# Patient Record
Sex: Male | Born: 1955 | Race: White | Hispanic: No | Marital: Married | Smoking: Current every day smoker
Health system: Southern US, Community
[De-identification: ages and names within clinical notes are randomized; demographics above are authoritative.]

## PROBLEM LIST (undated history)

## (undated) VITALS — BP 134/78 | HR 89 | Temp 97.3°F | Resp 12

## (undated) DIAGNOSIS — I10 Essential (primary) hypertension: Secondary | ICD-10-CM

## (undated) DIAGNOSIS — I2699 Other pulmonary embolism without acute cor pulmonale: Secondary | ICD-10-CM

## (undated) DIAGNOSIS — E119 Type 2 diabetes mellitus without complications: Secondary | ICD-10-CM

## (undated) DIAGNOSIS — M792 Neuralgia and neuritis, unspecified: Secondary | ICD-10-CM

## (undated) DIAGNOSIS — N1330 Unspecified hydronephrosis: Secondary | ICD-10-CM

## (undated) DIAGNOSIS — M5430 Sciatica, unspecified side: Secondary | ICD-10-CM

## (undated) DIAGNOSIS — G2581 Restless legs syndrome: Secondary | ICD-10-CM

## (undated) DIAGNOSIS — M1990 Unspecified osteoarthritis, unspecified site: Secondary | ICD-10-CM

## (undated) DIAGNOSIS — N35919 Unspecified urethral stricture, male, unspecified site: Secondary | ICD-10-CM

## (undated) DIAGNOSIS — E7800 Pure hypercholesterolemia, unspecified: Secondary | ICD-10-CM

## (undated) DIAGNOSIS — F419 Anxiety disorder, unspecified: Secondary | ICD-10-CM

## (undated) DIAGNOSIS — R339 Retention of urine, unspecified: Secondary | ICD-10-CM

## (undated) HISTORY — PX: TONSILLECTOMY: SUR1361

## (undated) HISTORY — DX: Restless legs syndrome: G25.81

## (undated) HISTORY — DX: Type 2 diabetes mellitus without complications: E11.9

## (undated) HISTORY — DX: Essential (primary) hypertension: I10

---
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
## 2016-07-02 ENCOUNTER — Other Ambulatory Visit
Admission: RE | Admit: 2016-07-02 | Discharge: 2016-07-02 | Disposition: A | Discharge status: Home / Self Care | Payer: Self-pay | Source: Ambulatory Visit | Admitting: Urology

## 2016-07-07 LAB — MEDICAL CYTOLOGY

## 2017-01-08 ENCOUNTER — Other Ambulatory Visit
Admission: RE | Admit: 2017-01-08 | Discharge: 2017-01-08 | Disposition: A | Discharge status: Home / Self Care | Payer: Self-pay | Source: Ambulatory Visit | Attending: Family | Admitting: Family

## 2017-01-09 LAB — AEROBIC CULTURE: Aerobic Culture: 0

## 2019-06-21 ENCOUNTER — Emergency Department: Payer: BC Managed Care – PPO

## 2019-06-21 ENCOUNTER — Encounter: Payer: Self-pay | Admitting: Emergency Medicine

## 2019-06-21 ENCOUNTER — Other Ambulatory Visit: Payer: Self-pay

## 2019-06-21 ENCOUNTER — Emergency Department
Admission: EM | Admit: 2019-06-21 | Discharge: 2019-06-21 | Disposition: A | Discharge status: Home / Self Care | Payer: BC Managed Care – PPO | Attending: Emergency Medicine | Admitting: Emergency Medicine

## 2019-06-21 DIAGNOSIS — I10 Essential (primary) hypertension: Secondary | ICD-10-CM | POA: Diagnosis not present

## 2019-06-21 DIAGNOSIS — E119 Type 2 diabetes mellitus without complications: Secondary | ICD-10-CM | POA: Insufficient documentation

## 2019-06-21 DIAGNOSIS — F17210 Nicotine dependence, cigarettes, uncomplicated: Secondary | ICD-10-CM | POA: Diagnosis not present

## 2019-06-21 DIAGNOSIS — M4726 Other spondylosis with radiculopathy, lumbar region: Secondary | ICD-10-CM

## 2019-06-21 DIAGNOSIS — M4727 Other spondylosis with radiculopathy, lumbosacral region: Secondary | ICD-10-CM | POA: Diagnosis not present

## 2019-06-21 DIAGNOSIS — M5136 Other intervertebral disc degeneration, lumbar region: Secondary | ICD-10-CM | POA: Insufficient documentation

## 2019-06-21 DIAGNOSIS — M545 Low back pain: Secondary | ICD-10-CM | POA: Diagnosis present

## 2019-06-21 LAB — URINALYSIS, COMPLETE (UACMP) WITH MICROSCOPIC
Bacteria, UA: NONE SEEN
Bilirubin Urine: NEGATIVE
Glucose, UA: >=500 mg/dL — AB
Hgb urine dipstick: NEGATIVE
Ketones, ur: NEGATIVE mg/dL
Leukocytes,Ua: NEGATIVE
Nitrite: NEGATIVE
Protein, ur: NEGATIVE mg/dL
Specific Gravity, Urine: 1.031 — ABNORMAL HIGH (ref 1.005–1.030)
pH: 5 (ref 5.0–8.0)

## 2019-06-21 LAB — GLUCOSE, CAPILLARY: Glucose-Capillary: 278 mg/dL — ABNORMAL HIGH (ref 70–99)

## 2019-06-21 MED ORDER — OXYCODONE HCL 5 MG PO TABS
5.0000 mg | ORAL_TABLET | Freq: Once | ORAL | Status: AC
  Administered 2019-06-21: 5 mg via ORAL
  Filled 2019-06-21: qty 1

## 2019-06-21 MED ORDER — OXYCODONE HCL 5 MG PO TABS: 5.0000 mg | ORAL_TABLET | Freq: Two times a day (BID) | ORAL | 0 refills | Status: AC | PRN

## 2019-06-21 MED ORDER — ACETAMINOPHEN 500 MG PO TABS
1000.0000 mg | ORAL_TABLET | Freq: Once | ORAL | Status: AC
  Administered 2019-06-21: 05:00:00 1000 mg via ORAL
  Filled 2019-06-21: qty 2

## 2019-06-21 NOTE — Discharge Instructions (Addendum)
You have been seen in the Emergency Department (ED)  today for back pain.  Back pain has many possible causes some are related to muscles while others have more serious causes. Even though you were checked carefully today and your exam and evaluation were reassuring, problems may develop later or continue to unfold. Therefore it is imperative that you follow up with doctor closely for further evaluation.  Follow-up with your doctor in 1 day for further evaluation.  For pain control take: tylenol 1000mg  3 times a day and 5mg  of oxycodone as needed every 6 hours  When should you call for help?  Call your doctor now or seek immediate medical care if:  You have new or worsening numbness in your legs.  You have new or worsening weakness in your legs. (This could make it hard to stand up.)  You lose control of your bladder or bowels or if you are unable to urinate. You have numbness of your groin or buttock region If you develop a fever  Watch closely for changes in your health, and be sure to contact your doctor if:  Your pain gets worse.  You are not getting better after 2 weeks.  How can you care for yourself at home?  Take pain medicines exactly as directed.  If the doctor gave you a prescription medicine for pain, take it as prescribed.  If you are not taking a prescription pain medicine, ask your doctor if you can take an over-the-counter medicine like tylenol or ibuprofen. Sit or lie in positions that are most comfortable and reduce your pain. Try one of these positions when you lie down:  Lie on your back with your knees bent and supported by large pillows.  Lie on the floor with your legs on the seat of a sofa or chair.  Lie on your side with your knees and hips bent and a pillow between your legs.  Lie on your stomach if it does not make pain worse. Do not sit up in bed, and avoid soft couches and twisted positions. Bed rest can help relieve pain at first, but it delays healing. Avoid  bed rest after the first day of back pain.  Change positions every 30 minutes. If you must sit for long periods of time, take breaks from sitting. Get up and walk around, or lie in a comfortable position.  Try using a heating pad on a low or medium setting for 15 to 20 minutes every 2 or 3 hours. Try a warm shower in place of one session with the heating pad.  You can also try an ice pack for 10 to 15 minutes every 2 to 3 hours. Put a thin cloth between the ice pack and your skin.  Take short walks several times a day. You can start with 5 to 10 minutes, 3 or 4 times a day, and work up to longer walks. Walk on level surfaces and avoid hills and stairs until your back is better.  Return to work and other activities as soon as you can. Continued rest without activity is usually not good for your back.  To prevent future back pain, do exercises to stretch and strengthen your back and stomach. Learn how to use good posture, safe lifting techniques, and proper body mechanics.

## 2019-06-21 NOTE — ED Notes (Signed)
Patient transported to CT 

## 2019-06-21 NOTE — ED Notes (Signed)
Patient states having back pain from L3-L4 in that area. Per pt has had hx of sciatica.

## 2019-06-21 NOTE — ED Triage Notes (Signed)
Pt to triage via w/c, mask in place with no distress noted; pt c/o lower back pain; seen 2wks ago by chiropractor but pain has persisted

## 2019-06-21 NOTE — ED Provider Notes (Signed)
[facility] Emergency Department Provider Note  ____________________________________________  Time seen: Approximately 6:30 AM  I have reviewed the triage vital signs and the nursing notes.   HISTORY  Chief Complaint No chief complaint on file.   HPI Ian Hunter is a 63 y.o. male history of diabetes who presents for evaluation of back pain.  Pain started 2 weeks ago after patient went to the chiropractor.  Patient reports that he usually goes to the chiropractor to get "his hips back in place".  He left the chiropractor and had severe pain for 2 days.  The pain improved but was persistent for 2 weeks.  Over the last 24 hours the pain has become severe again.  The pain is located on the left paraspinal region, sharp and stabbing, constant and nonradiating.  He reports burning of the sole of the left foot which has also been there for 2 weeks.  He denies saddle anesthesia, urinary or bowel incontinence or retention, lower extremity weakness or numbness.  No history of IV drug use, no unintentional weight loss.   PMH HTN DM  Allergies Patient has no known allergies.  No family history on file.  Social History Social History   Tobacco Use   Smoking status: Current Every Day Smoker   Smokeless tobacco: Never Used  Substance Use Topics   Alcohol use: Not on file   Drug use: Not on file    Review of Systems  Constitutional: Negative for fever. Eyes: Negative for visual changes. ENT: Negative for sore throat. Neck: No neck pain  Cardiovascular: Negative for chest pain. Respiratory: Negative for shortness of breath. Gastrointestinal: Negative for abdominal pain, vomiting or diarrhea. Genitourinary: Negative for dysuria. Musculoskeletal: + back pain. Skin: Negative for rash. Neurological: Negative for headaches, weakness or numbness. Psych: No SI or HI  ____________________________________________   PHYSICAL EXAM:  VITAL SIGNS: ED Triage  Vitals  Enc Vitals Group     BP 06/21/19 0137 (!) 137/94     Pulse Rate 06/21/19 0137 99     Resp 06/21/19 0137 (!) 22     Temp 06/21/19 0137 98 F (36.7 C)     Temp Source 06/21/19 0137 Oral     SpO2 06/21/19 0137 97 %     Weight 06/21/19 0136 294 lb (133.4 kg)     Height 06/21/19 0136 6\' 2"  (1.88 m)     Head Circumference --      Peak Flow --      Pain Score 06/21/19 0136 8     Pain Loc --      Pain Edu? --      Excl. in [place]? --     Constitutional: Alert and oriented. Well appearing and in no apparent distress. HEENT:      Head: Normocephalic and atraumatic.         Eyes: Conjunctivae are normal. Sclera is non-icteric.       Mouth/Throat: Mucous membranes are moist.       Neck: Supple with no signs of meningismus. Cardiovascular: Regular rate and rhythm. Respiratory: Normal respiratory effort.  Gastrointestinal: Soft, non tender, and non distended with positive bowel sounds. No rebound or guarding. Musculoskeletal: No midline CT and L-spine tenderness.  Patient has tenderness to palpation on the left paraspinal lumbar region.  No rash. Neurologic: Normal speech and language. Face is symmetric. Moving all extremities.  Normal gait, 2+ patella DTR bilaterally, intact strength and sensation of bilateral lower extremities Skin: Skin is warm, dry and  intact. No rash noted. Psychiatric: Mood and affect are normal. Speech and behavior are normal.  ____________________________________________   LABS (all labs ordered are listed, but only abnormal results are displayed)  Labs Reviewed  URINALYSIS, COMPLETE (UACMP) WITH MICROSCOPIC - Abnormal; Notable for the following components:      Result Value   Color, Urine YELLOW (*)    APPearance CLEAR (*)    Specific Gravity, Urine 1.031 (*)    Glucose, UA >=500 (*)    All other components within normal limits  GLUCOSE, CAPILLARY - Abnormal; Notable for the following components:   Glucose-Capillary 278 (*)    All other components  within normal limits  CBG MONITORING, ED   ____________________________________________  EKG  none  ____________________________________________  RADIOLOGY  I have personally reviewed the images performed during this visit and I agree with the Radiologist's read.   Interpretation by Radiologist:  Ct Lumbar Spine Wo Contrast  Result Date: 06/21/2019 CLINICAL DATA:  Back pain EXAM: CT LUMBAR SPINE WITHOUT CONTRAST TECHNIQUE: Multidetector CT imaging of the lumbar spine was performed without intravenous contrast administration. Multiplanar CT image reconstructions were also generated. COMPARISON:  None. FINDINGS: Segmentation: 5 lumbar type vertebrae Alignment: Normal Vertebrae: No acute fracture or focal pathologic process. Paraspinal and other soft tissues: Partial coverage of full urinary bladder, nonspecific. Atherosclerotic calcification. Disc levels: Mild multilevel endplate spurring with mild L3-4 disc narrowing. Facet degeneration at L4-5 and L5-S1 prominent spurring on the right at L5-S1. No visible impingement. IMPRESSION: 1. No acute finding. 2. Degenerative changes without visible impingement. Electronically Signed   By: Marnee Spring M.D.   On: 06/21/2019 06:22     ____________________________________________   PROCEDURES  Procedure(s) performed: None Procedures Critical Care performed:  None ____________________________________________   INITIAL IMPRESSION / ASSESSMENT AND PLAN / ED COURSE   63 y.o. male history of diabetes who presents for evaluation of back pain x 2 weeks since going to the chiropractor. Patient is neurologically intact with no signs of cauda equina, normal gait, strength, sensation, and intact DTRs. Abdomen is soft and non tender with no pulsatile mass. CT showing DDD and arthritis of the spine with no fracture or visible impingement. Patient referred to orthopedics. Given short course of oxycodone for pain. Discussed signs and symptoms of cauda  equina and recommended return to the emergency room if these develop.  Otherwise patient will follow-up with orthopedics.       As part of my medical decision making, I reviewed the following data within the electronic MEDICAL RECORD NUMBER Nursing notes reviewed and incorporated, Labs reviewed , Old chart reviewed, Radiograph reviewed , Notes from prior ED visits and [place] Controlled Substance Database   Patient was evaluated in Emergency Department today for the symptoms described in the history of present illness. Patient was evaluated in the context of the global COVID-19 pandemic, which necessitated consideration that the patient might be at risk for infection with the SARS-CoV-2 virus that causes COVID-19. Institutional protocols and algorithms that pertain to the evaluation of patients at risk for COVID-19 are in a state of rapid change based on information released by regulatory bodies including the CDC and federal and state organizations. These policies and algorithms were followed during the patient's care in the ED.   ____________________________________________   FINAL CLINICAL IMPRESSION(S) / ED DIAGNOSES   Final diagnoses:  Osteoarthritis of spine with radiculopathy, lumbar region      NEW MEDICATIONS STARTED DURING THIS VISIT:  ED Discharge Orders    None  Note:  This document was prepared using Dragon voice recognition software and may include unintentional dictation errors.    Don PerkingVeronese, [place], MD 06/21/19 [phone]

## 2020-07-04 IMAGING — CT CT L SPINE W/O CM
3 series · 14 of 33 positions shown, 17 images · non-contrast
Comparison: None.

CLINICAL DATA: Back pain

EXAM:
CT LUMBAR SPINE WITHOUT CONTRAST
TECHNIQUE: Multidetector CT imaging of the lumbar spine was performed without
intravenous contrast administration. Multiplanar CT image
reconstructions were also generated.

[Series 4: l spine soft · axial · 0.38mm/px · z∈[-1058,-868]mm · 6 of 125 slices shown, 8 images]
[im 20/125  soft-tissue]
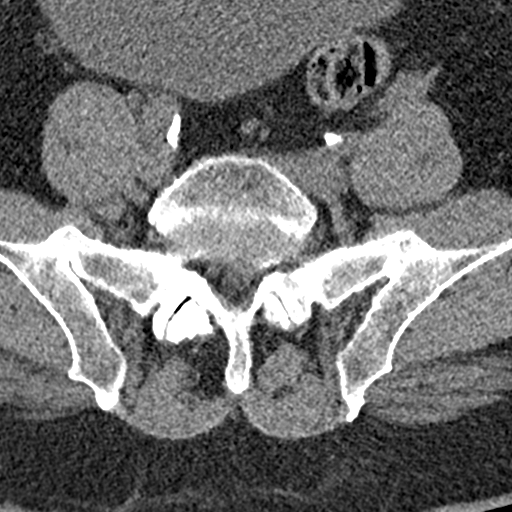
[im 20/125  bone]
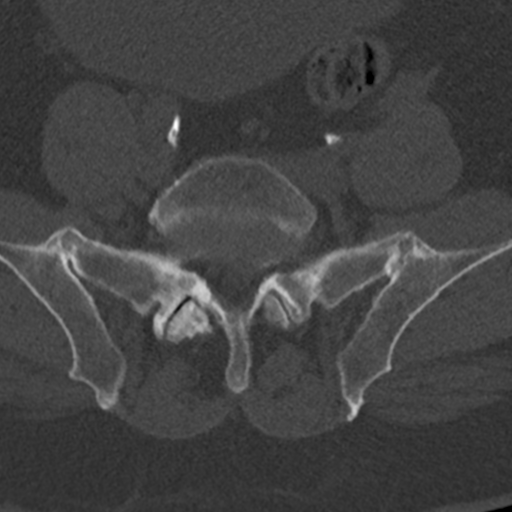
[im 39/125  bone]
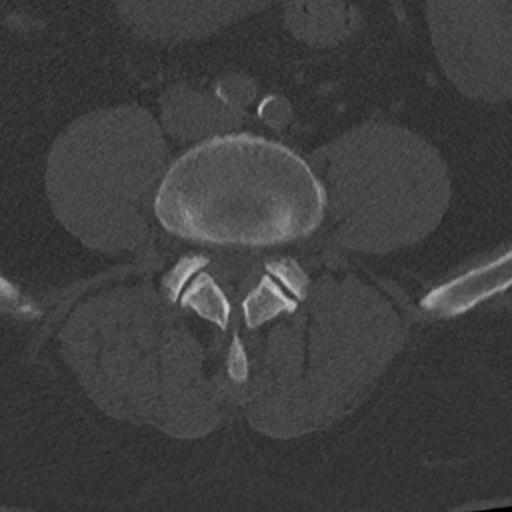
[im 58/125  bone]
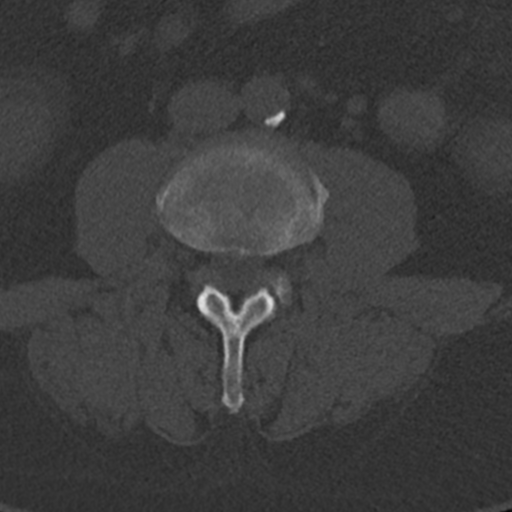
[im 77/125  bone]
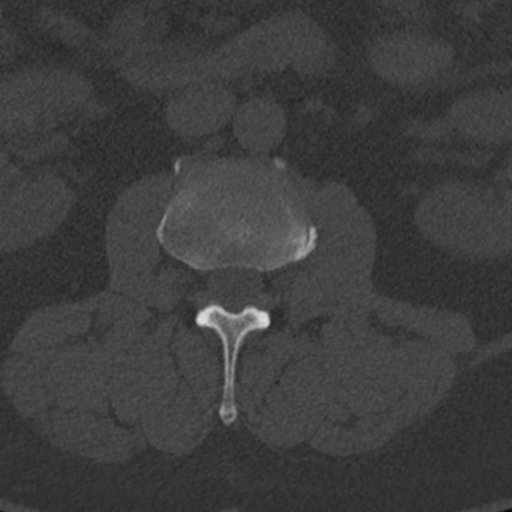
[im 96/125  soft-tissue]
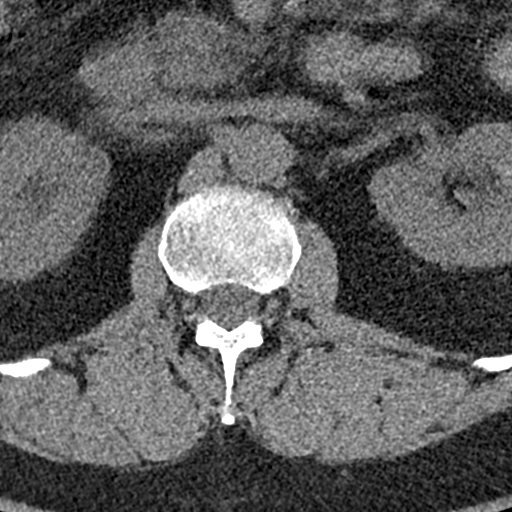
[im 96/125  bone]
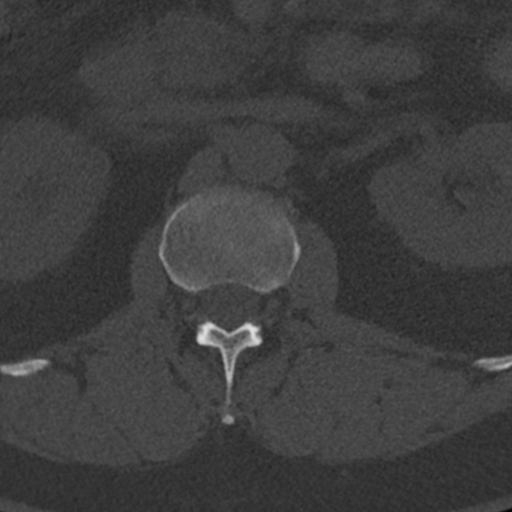
[im 115/125  bone]
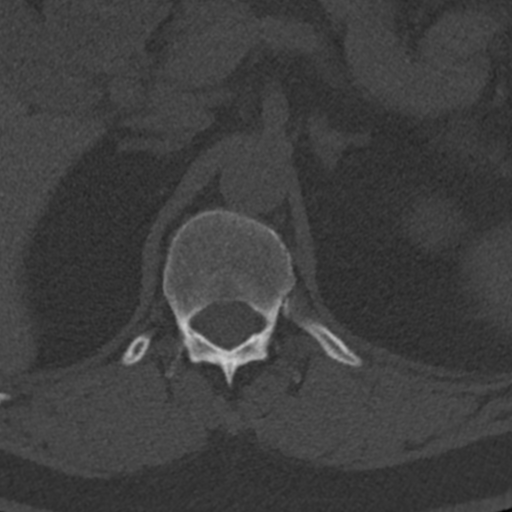

[Series 5: sagittal bone · sagittal · 0.36mm/px · 5 of 61 slices shown, 6 images]
[im 21/61  bone]
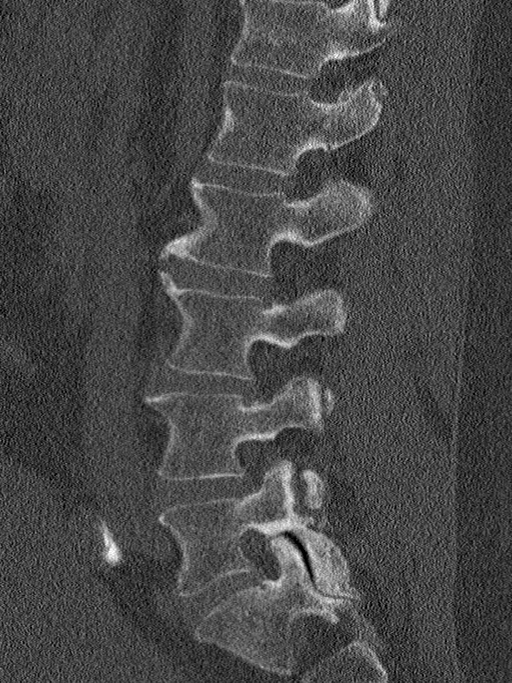
[im 26/61  bone]
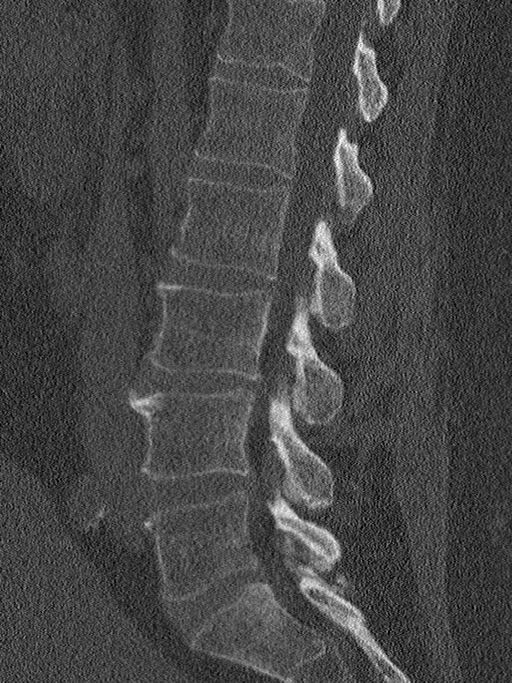
[im 31/61  soft-tissue]
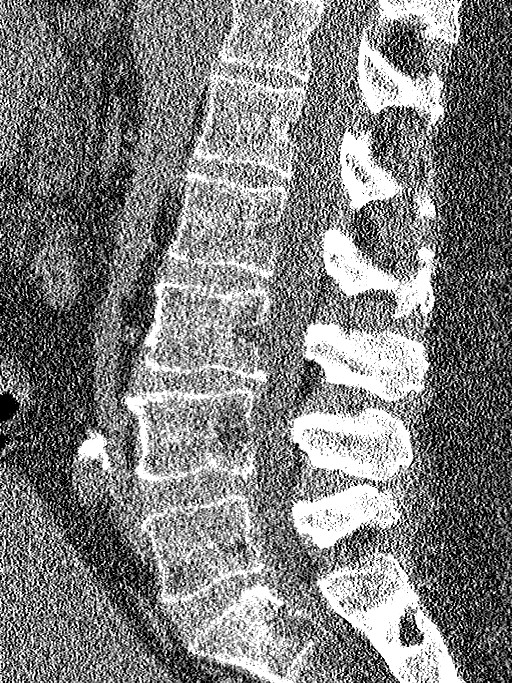
[im 31/61  bone]
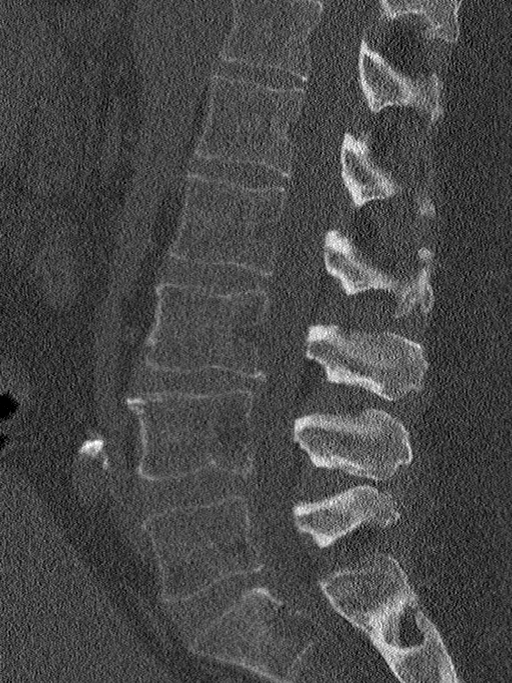
[im 36/61  bone]
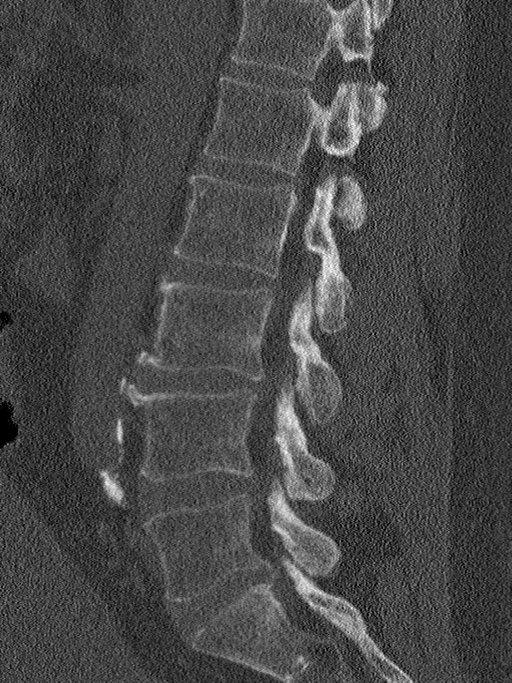
[im 41/61  bone]
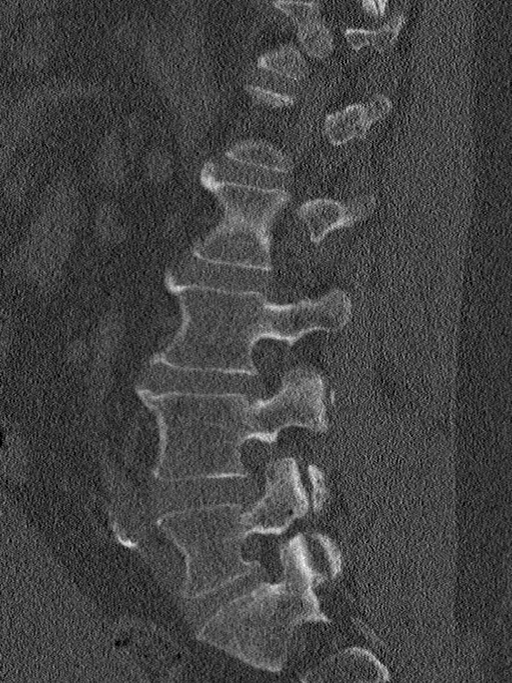

[Series 6: coronal bone · coronal · 0.36mm/px · 3 of 61 slices shown]
[im 13/61  bone]
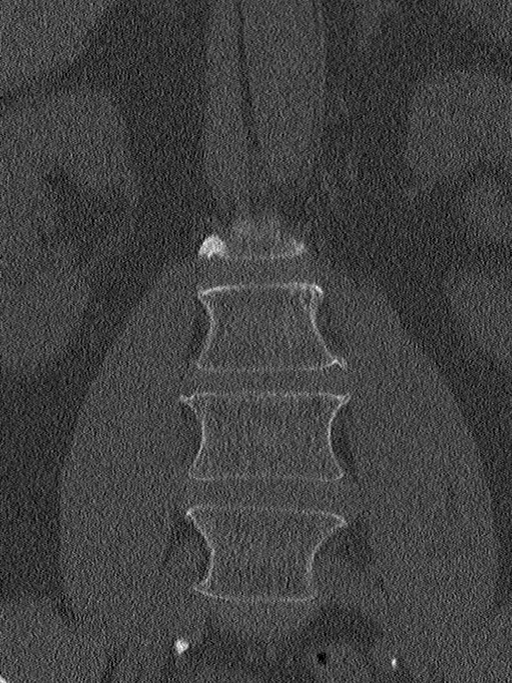
[im 25/61  bone]
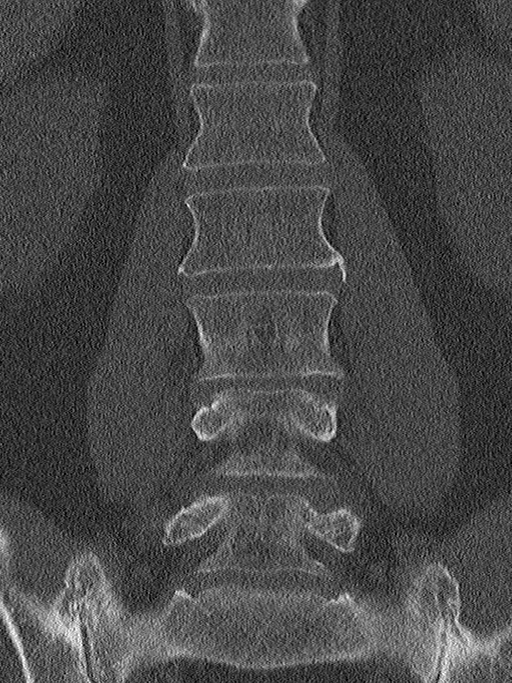
[im 37/61  bone]
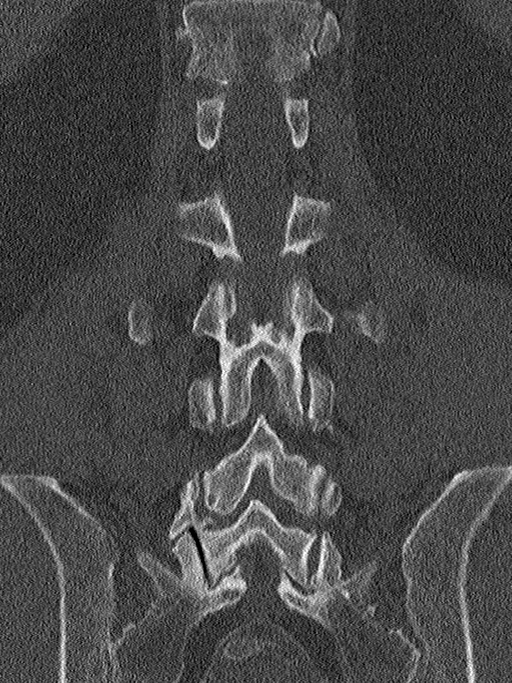

[14 of 33 positions shown; findings below may reference images not displayed]

FINDINGS: Segmentation: 5 lumbar type vertebrae

Alignment: Normal

Vertebrae: No acute fracture or focal pathologic process.

Paraspinal and other soft tissues: Partial coverage of full urinary
bladder, nonspecific. Atherosclerotic calcification.

Disc levels: Mild multilevel endplate spurring with mild L3-4 disc
narrowing. Facet degeneration at L4-5 and L5-S1 prominent spurring
on the right at L5-S1. No visible impingement.
IMPRESSION: 1. No acute finding.
2. Degenerative changes without visible impingement.

## 2020-08-13 ENCOUNTER — Encounter: Payer: Self-pay | Admitting: Primary Care

## 2020-08-13 NOTE — Progress Notes (Signed)
Patient Intake    Problem    Problems/Comments    Patient presents to the office today for a 3 month follow up. Patient states   he is starting to lose   the feeling in his feet and he knows it is related to his blood sugars.   Patient states he does not   monitor his sugars so "it is what it is". Patient would like a refill of   lisinopril 5mg  sent to   [facility]. Patient is due for CRCS.        Medication list    Medications    Last Reconciled on 08/13/20 13:37 by Karon Heckendorn C Willys Salvino PAC    Clonazepam (Clonazepam 1 mg) 1 Mg Tab    1 MG PO BID MDD TWO for 30 Days, #60 TAB 0 Refills       Prov: Brien Lowe,Nylan Nakatani C PA-C         07/03/20     Cyclobenzaprine Hcl (Flexeril 10mg ) 10 Mg Tab    1 TAB PO TID PRN for Muscle Spasms for 10 Days, #30 TAB 1 Refill       Prov: Nickolai Rinks,Jaidan Stachnik C PA-C         06/28/20     Fluoxetine HCl (Fluoxetine 10 mg) 10 Mg Tab    10 MG PO DAILY, #90 TAB 2 Refills       Prov: Pearson Picou,Tymara Saur C PA-C         06/28/20     Pramipexole Dihydrochloride (Pramipexole 1 MG) 1 Mg Tab    1 TAB PO QID, #120 TAB 2 Refills       Prov: Falana Clagg,Deyon Chizek C PA-C         06/24/20     Atorvastatin Calcium (Lipitor 10 Mg) 10 Mg Tab    10 MG PO DAILY, #30 TAB 5 Refills       Prov: Neyra Pettie,Bowden Boody C PA-C         05/02/20     Glimepiride (Amaryl 4mg ) 4 Mg Tab    4 MG PO DAILY, #90 TAB 2 Refills       Prov: GEMBUSIA,KEITH A DO         04/12/20     Lisinopril (Lisinopril 5 mg) 5 Mg Tab    5 MG PO DAILY, #90 TAB       Reported         02/15/20     Apixaban (Eliquis 2.5 MG) 2.5 Mg Tab    1 TAB PO BID, #180 TAB       Reported         02/15/20    PMP Checked:  Not Applicable    Nurse update/review meds w/Pt:  Yes    Date updated/reviewed:  Aug 13, 2020        Medication Management    Understands current meds:  Yes    Date reviewed w/pt, family:  Aug 13, 2020    Date barriers identified:  Aug 13, 2020        Allergies    Allergies:      Coded Allergies:           Monosodium Glutamate (Verified  Allergy, Unknown, ANXIETY/FEELING OF   FEAR, 02/25/15)         Vitals    Vitals:           Weight 286 lbs  / 539.767341 kg         Temperature 98.3 F / 36.83 C - Tympanic  Pulse 83         Respirations 16         Blood Pressure 128/82 Sitting, Left Arm         Pulse Oximetry 98%        Pain Scale    Is the patient having any pain:  No        Self Referral since last visit    Patient self referred?:  No        Educ: Discussed/Distributed    Education Discussed with Pt:  Yes    Educational Materials:  Medication, Follow up visit, Vital signs        PHQ-2 Depression Screening    Instructions    Over the last 2 weeks, how often have you been bothered by any of the   following problems?  (Please   circle your response)    Established Patient Assessment:  Routine Vitals, Check-In, Review of Past   Record    Patient monitoring:  Pulse Ox        Nurse Code Visit    [id number] BP,Wt Chk,Pill Ct, Etc:  No        Performing Nurse:    Performing Nurse:    08/13/20 13:24  [place]        Chief Complaint    Chief Complaint    routine follow up appointment        HPI    History of Present Illness    Details    Craig Gilbert presents to the office today for a routine follow up appointment. The   patient was last seen in  the office back in August 2021. The patient did not have his blood work   completed that was ordered   at his last appointment. 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 He does not check his blood sugars   at all. He admits that   he started to lose feeling in his feet and he suspects that this is related to   diabetic neuropathy.    Vital Signs Reviewed:  Vital signs obtained today reviewed        Past, Family,   Social History    Tobacco Use    Tobacco status:  Current every day smoker    Smoking packs/day:  1    Smoking history:  > 50 pack years    Quit status:  Not considering quitting    Counseling given:  Provider counseling        Alcohol Use    Alcohol intake:  None    Counseling  given:  Provider counseling        Substance Use    Substance use:  Marijuana    Counseling given:  Provider counseling        Past Medical History    Endocrine:  REPORTS HX OF: Diabetes mellitus (uncontrolled), Obesity    Respiratory:  REPORTS HX OF: Asthma, Pneumonia    Cardiovascular:  REPORTS HX OF: Deep venous thrombosis (12/2018 [place]   - right DVT),   Hyperlipidemia, Hypertension, Prev pulmonary embolism (06/2015 [place] admission -   bilateral PE)    Neurologic:  REPORTS HX OF: Restless leg syndrome    Psychiatric:  REPORTS HX OF: Anxiety, Depression        Past Surgical History    HEENT:  REPORTS HX OF: Tonsillectomy (as a child)  Family Medical History    Family history:  REPORTS HX OF: Diabetes mellitus (mom, dad, brother),   Hypertension (mom, dad)    Family history - cancer:  REPORTS HX OF: Breast cancer (mom - diagnosed and   deceased from at 31)        ROS    Negative ROS:  Nothing on open ROS        EXAM-Family Practice    Constitutional    General appearance:  Comfortable    Nutritional status:  Overweight    Orientation:  A/O x3        Skin    Skin:  FINDINGS: Warm/Dry        Head, Eyes, Ears, Nose, Throat    Head:  normocephalic/atraumatic    Eyes:  Conjunctivae clear        Respiratory    Respiratory effort:  FINDINGS: normal; NEGATIVE FOR: labored    Auscultation:  FINDINGS: Wheezes (sporadic wheezing noted in the bilateral   upper lobes)        Cardiovascular    Rhythm/Rate:  Regular        Abdomen/Groin    Abdomen:  Central obesity        Psychiatric    Mental status:  grossly normal        Assessment/Plan -    Problem List and Billing Codes:  Yes    Greater than 4 dxs([facility]):  Yes    Ambulatory assessment/plan:      Diabetes mellitus type II, uncontrolled       Uncontrolled type 2 diabetes mellitus with hyperglycemia - E11.65        History of venous thromboembolism - Z86.718        Anxiety - F41.9        Depression       Recurrent major depressive disorder, in partial  remission - F33.41        Obesity - E66.9        Colon cancer screening - Z12.11        Notes    Chart, vitals, and medications are reviewed with the patient today.    Encouraged him to start   checking his blood sugars, eat a well-balanced diet, and increase his activity   level.  He will   continue all medications as prescribed and a prescription refill was sent for   lisinopril today.    Will complete blood work after obtaining health insurance.  He is also   interested in Cologuard and   we will check on this for him.  Cologuard ordered.  We will see him back in   the office in 4 months,   sooner if needed.  He will call the office with any concerns or questions.    Renewed Medications    * Lisinopril (Lisinopril 5 mg):         Visit Level - Established    Low 20-29 mins:  Yes                <Electronically signed by Reggy Eye PA-C>        08/13/20 1402

## 2020-10-25 ENCOUNTER — Encounter: Payer: Self-pay | Admitting: Primary Care

## 2020-10-29 ENCOUNTER — Other Ambulatory Visit: Payer: Self-pay | Admitting: Primary Care

## 2020-11-19 ENCOUNTER — Other Ambulatory Visit: Payer: Self-pay | Admitting: Primary Care

## 2020-11-19 ENCOUNTER — Other Ambulatory Visit: Payer: Self-pay | Admitting: Gastroenterology

## 2020-11-19 LAB — LIPID PANEL
Chol/HDL Ratio: 4 (ref 0–4.88)
Chol/HDL Ratio: 4 (ref 0–4.88)
Chol/HDL Ratio: 4 (ref 0–4.88)
Cholesterol: 178 mg/dL (ref 130–200)
Cholesterol: 178 mg/dL (ref 130–200)
Cholesterol: 178 mg/dL (ref 130–200)
HDL: 42 mg/dL (ref 35–80)
HDL: 42 mg/dL (ref 35–80)
HDL: 42 mg/dL (ref 35–80)
LDL Direct: 114 mg/dL (ref <100)
LDL Direct: 114 mg/dL (ref <100)
LDL Direct: 114 mg/dL (ref <100)
Triglycerides: 144 mg/dL (ref 48–200)
Triglycerides: 144 mg/dL (ref 48–200)
Triglycerides: 144 mg/dL (ref 48–200)

## 2020-11-19 LAB — PSA (EFF.4-2010): PSA (eff. 4-2010): 1.06 ng/mL (ref 0.00–4.00)

## 2020-11-19 LAB — COMPREHENSIVE METABOLIC PANEL
ALT: 14 U/L (ref 0–50)
ALT: 14 U/L (ref 0–50)
ALT: 14 U/L (ref 0–50)
AST: 12 U/L (ref 0–50)
AST: 12 U/L (ref 0–50)
AST: 12 U/L (ref 0–50)
Albumin: 4.1 gm/dL (ref 3.5–5.0)
Albumin: 4.1 gm/dL (ref 3.5–5.0)
Albumin: 4.1 gm/dL (ref 3.5–5.0)
Alk Phos: 143 U/L — ABNORMAL HIGH (ref 38–126)
Alk Phos: 143 U/L — ABNORMAL HIGH (ref 38–126)
Alk Phos: 143 U/L — ABNORMAL HIGH (ref 38–126)
Anion Gap: 15 MMOL/L
Anion Gap: 15 MMOL/L
Anion Gap: 15 MMOL/L
Bilirubin,Total: 0.4 mg/dL (ref 0.0–1.2)
Bilirubin,Total: 0.4 mg/dL (ref 0.0–1.2)
Bilirubin,Total: 0.4 mg/dL (ref 0.0–1.2)
CO2: 25 mmol/L (ref 21–31)
CO2: 25 mmol/L (ref 21–31)
CO2: 25 mmol/L (ref 21–31)
Calcium: 9.2 mg/dL (ref 8.6–10.2)
Calcium: 9.2 mg/dL (ref 8.6–10.2)
Calcium: 9.2 mg/dL (ref 8.6–10.2)
Chloride: 96 mmol/L — ABNORMAL LOW (ref 98–107)
Chloride: 96 mmol/L — ABNORMAL LOW (ref 98–107)
Chloride: 96 mmol/L — ABNORMAL LOW (ref 98–107)
Creatinine: 1.1 mg/dL (ref 0.6–1.3)
Creatinine: 1.1 mg/dL (ref 0.6–1.3)
Creatinine: 1.1 mg/dL (ref 0.6–1.3)
GFR,Black: 82.07 mL/min/{1.73_m2} (ref 60–999)
GFR,Black: 82.07 mL/min/{1.73_m2} (ref 60–999)
GFR,Black: 82.07 mL/min/{1.73_m2} (ref 60–999)
GFR,Caucasian: 67.83 mL/min/{1.73_m2} (ref 60–999)
GFR,Caucasian: 67.83 mL/min/{1.73_m2} (ref 60–999)
GFR,Caucasian: 67.83 mL/min/{1.73_m2} (ref 60–999)
Glucose: 321 mg/dL — ABNORMAL HIGH (ref 60–99)
Glucose: 321 mg/dL — ABNORMAL HIGH (ref 60–99)
Glucose: 321 mg/dL — ABNORMAL HIGH (ref 60–99)
Lab: 18 mg/dL (ref 6–20)
Lab: 18 mg/dL (ref 6–20)
Lab: 18 mg/dL (ref 6–20)
Potassium: 4.1 mmol/L (ref 3.4–4.7)
Potassium: 4.1 mmol/L (ref 3.4–4.7)
Potassium: 4.1 mmol/L (ref 3.4–4.7)
Sodium: 132 mmol/L — ABNORMAL LOW (ref 133–145)
Sodium: 132 mmol/L — ABNORMAL LOW (ref 133–145)
Sodium: 132 mmol/L — ABNORMAL LOW (ref 133–145)
Total Protein: 7.2 gm/dL (ref 6.0–8.3)
Total Protein: 7.2 gm/dL (ref 6.0–8.3)
Total Protein: 7.2 gm/dL (ref 6.0–8.3)

## 2020-11-19 LAB — CBC
Baso # K/uL: 0 10*3/uL (ref 0–0.20)
Basophil %: 0.3 % (ref 0–1)
Eos # K/uL: 0.4 10*3/uL (ref 0–0.45)
Eosinophil %: 5 % (ref 0–5)
Hematocrit: 48.3 % (ref 42.0–52.0)
Hemoglobin: 16.6 gm/DL (ref 12.0–18.0)
IMM Granulocytes: 0.4 % (ref 0–1)
Immature Granulocytes Absolute: 0.03 10*3/uL
Lymph # K/uL: 1.5 10*3/uL (ref 1.0–4.8)
Lymphocyte %: 21.1 % — ABNORMAL LOW (ref 25.0–45.0)
MCH: 30.4 UUG (ref 29–32)
MCHC: 34.4 g/dl (ref 30–36)
MCV: 88.5 fl (ref 84.5–98.5)
Mono # K/uL: 0.4 10*3/uL (ref 0–0.80)
Monocyte %: 6.2 % (ref 1.7–9.3)
Neut # K/uL: 4.7 10*3/uL (ref 1.8–7.8)
Nucl RBC # K/uL: 0 10*3/uL (ref 0.0–0.0)
Nucl RBC %: 0 % (ref 0–2)
Platelets: 183 10*3/uL (ref 150–400)
RBC: 5.46 M/uL (ref 4.5–6.5)
RDW: 12 % (ref 11.5–14.5)
Seg Neut %: 67 % — ABNORMAL HIGH (ref 40–60)
WBC: 7.1 10*3/uL (ref 5.0–11.0)

## 2020-11-19 LAB — TSH
TSH: 2.16 u[IU]/mL (ref 0.27–4.20)
TSH: 2.16 u[IU]/mL (ref 0.27–4.20)
TSH: 2.16 u[IU]/mL (ref 0.27–4.20)

## 2020-11-19 LAB — HEMOGLOBIN A1C: Hemoglobin A1C: 10.4 % — ABNORMAL HIGH (ref 4.3–5.8)

## 2020-11-19 NOTE — Progress Notes (Signed)
Patient Intake    Problem    Problems/Comments    Patient presents to the office today for a sensitive spot on abdoman. Patient   stated he ran out of   eliquis while in [place]. He was given an injection to replace it. Patient states   he has a spot on his   back that itches all the time.        Medication list    Medications    Last Reconciled on 10/25/20 10:53 by Yulisa Chirico C Bellamie Turney PAC    Clonazepam (Clonazepam 1 mg) 1 Mg Tab    1 MG PO BID MDD TWO, #60 TAB       Prov: Rem[place]ton Urieta,Harlee Pursifull C PA-C         09/03/20     Lisinopril (Lisinopril 5 mg) 5 Mg Tab    5 MG PO DAILY, #90 TAB 1 Refill       Prov: Lola Lofaro,Iyanla Eilers C PA-C         08/13/20     Cyclobenzaprine Hcl (Flexeril 10mg ) 10 Mg Tab    1 TAB PO TID PRN for Muscle Spasms for 10 Days, #30 TAB 1 Refill       Prov: Wendy Hoback,Sebastiana Wuest C PA-C         06/28/20     Fluoxetine HCl (Fluoxetine 10 mg) 10 Mg Tab    10 MG PO DAILY, #90 TAB 2 Refills       Prov: Evans Levee,Jazmarie Biever C PA-C         06/28/20     Pramipexole Dihydrochloride (Pramipexole 1 MG) 1 Mg Tab    1 TAB PO QID, #120 TAB 2 Refills       Prov: Sherrin Stahle,Kalsey Lull C PA-C         06/24/20     Atorvastatin Calcium (Lipitor 10 Mg) 10 Mg Tab    10 MG PO DAILY, #30 TAB 5 Refills       Prov: Nirali Magouirk,Hally Colella C PA-C         05/02/20     Glimepiride (Amaryl 4mg ) 4 Mg Tab    4 MG PO DAILY, #90 TAB 2 Refills       Prov: GEMBUSIA,KEITH A DO         04/12/20     Apixaban (Eliquis 2.5 MG) 2.5 Mg Tab    1 TAB PO BID, #180 TAB       Reported         02/15/20    PMP Checked:  Yes (reference #)    Nurse update/review meds w/Pt:  Yes    Date updated/reviewed:  Oct 25, 2020        Medication Management    Understands current meds:  Yes    Date reviewed w/pt, family:  Oct 25, 2020    Acknowledg barrier 2 adherence:  Yes    Date barriers identified:  Oct 25, 2020        Allergies    Allergies:      Coded Allergies:           Monosodium Glutamate (Verified  Allergy, Unknown, ANXIETY/FEELING OF   FEAR, 02/25/15)        Vitals    Vitals:           Weight 290 lbs  /  858.850277 kg         Temperature 98.4 F / 36.89 C - Temporal         Pulse 89  Respirations 16         Blood Pressure 130/88 Sitting, Right Arm         Pulse Oximetry 98%        Pain Scale    Is the patient having any pain:  Yes    Pain Location    Right abdomen.    Pain rating :         Pain score:  4       Pain scale used:  0-10 numeric scale        Self Referral since last visit    Patient self referred?:  No        Flu vaccine (Sept/Mar)    Vaccine Status:  Received this season        Educ: Discussed/Distributed    Education Discussed with Pt:  Yes    Educational Materials:  Medication, Follow up visit, Vital signs        PHQ-2 Depression Screening    Instructions    Over the last 2 weeks, how often have you been bothered by any of the   following problems?  (Please   circle your response)    Established Patient Assessment:  Routine Vitals, Check-In, Review of Past   Record    Patient monitoring:  Pulse Ox        Nurse Code Visit    [phone] BP,Wt Chk,Pill Ct, Etc:  No        Performing Nurse:    Performing Nurse:    10/25/20 10:34  Jeral Pinch        Chief Complaint    Chief Complaint    acute visit        HPI    History of Present Illness    Details    Craig Gilbert presents to the office today for an acute visit. The patient was last   seen in the office in   November 2021. He has several concerns that he would like to address today.   The patient has a   sensitive spot on his abdomen. His right side of the abdomen is painful to   touch. Even his shirt   resting on his skin is painful. He has no changes in bowel, diarrhea,   constipation, nausea, or   vomiting. The pain has been getting worse. It is both his right upper and   lower quadrant. He also   notes an itchy spot on his back that bothers him most of the time.    Vital Signs Reviewed:  Vital signs obtained today reviewed        Past, Family,   Social History    Tobacco Use    Tobacco status:  Current every day smoker    Smoking packs/day:  1    Smoking  history:  > 50 pack years    Quit status:  Not considering quitting    Counseling given:  Provider counseling        Alcohol Use    Alcohol intake:  None    Counseling given:  Provider counseling        Substance Use    Substance use:  Marijuana    Counseling given:  Provider counseling        Past Medical History    Endocrine:  REPORTS HX OF: Diabetes mellitus (uncontrolled), Obesity    Respiratory:  REPORTS HX OF: Asthma, Pneumonia    Cardiovascular:  REPORTS HX OF: Deep venous thrombosis (12/2018 [place]   -  right DVT),   Hyperlipidemia, Hypertension, Prev pulmonary embolism (06/2015 [facility] admission -   bilateral PE)    Neurologic:  REPORTS HX OF: Restless leg syndrome    Psychiatric:  REPORTS HX OF: Anxiety, Depression        Past Surgical History    HEENT:  REPORTS HX OF: Tonsillectomy (as a child)        Family Medical History    Family history:  REPORTS HX OF: Diabetes mellitus (mom, dad, brother),   Hypertension (mom, dad)    Family history - cancer:  REPORTS HX OF: Breast cancer (mom - diagnosed and   deceased from at 79)        ROS    Constitutional:  Denies: Fatigue, Fever    Gastrointestinal:  Complains of: Abdominal pain; Denies: Nausea, Vomiting,   Reflux/heartburn,   Constipation, Diarrhea, Black stools, Bloody stools        EXAM-Family Practice    Constitutional    General appearance:  Comfortable    Nutritional status:  Obese    Orientation:  A/O x3        Skin    Skin:  FINDINGS: Warm/Dry, other (no lesions noted)        Head, Eyes, Ears, Nose, Throat    Head:  normocephalic/atraumatic    Eyes:  Conjunctivae clear        Respiratory    Respiratory effort:  FINDINGS: normal; NEGATIVE FOR: labored    Auscultation:  FINDINGS: Normal; NEGATIVE FOR: Crackles/rales, Wheezes        Cardiovascular    Rhythm/Rate:  Regular        Abdomen/Groin    Abdomen:  Tenderness (RUQ, RLQ), Central obesity; No: Mass    Bowel sounds:  Present        Psychiatric    Mental status:  grossly normal         Assessment/Plan -    Problem List and Billing Codes:  Yes    Greater than 4 dxs(BC/[facility]):  No    Ambulatory assessment/plan:      Right sided abdominal pain - R10.9        Notes    Chart, vitals, and medications reviewed with the patient today. Encouraged   heat/ice, rest, and OTCs   for pain relief. [place] of his abdomen ordered given his pain. Suspect a CT scan   will be warranted   following. We will follow up with him once his [place] is completed.    Discontinued Medications    * CLONAZEPAM (Clonazepam 1 mg):     New Diagnostics    * US Abdomen Complete       Dx: Right sided abdominal pain - R10.9        Visit Level - Established    Low 20-29 mins:  Yes        Time Spent    I personally spent 22 minutes on the calendar day of the encounter, including   pre and post visit   work.                <Electronically signed by Reggy Eye PA-C>        11/19/20 1608

## 2020-11-21 ENCOUNTER — Telehealth: Payer: Self-pay | Admitting: Urology

## 2020-11-21 NOTE — Telephone Encounter (Signed)
Urgent referral received, called and spoke with patient, scheduled for 12/05/20 at [facility], patient verbalized understanding.

## 2020-12-05 ENCOUNTER — Encounter: Payer: Self-pay | Admitting: Urology

## 2020-12-05 ENCOUNTER — Ambulatory Visit: Payer: Medicaid Other | Admitting: Urology

## 2020-12-05 ENCOUNTER — Other Ambulatory Visit: Payer: Self-pay | Admitting: Urology

## 2020-12-05 VITALS — BP 173/99 | HR 89 | Temp 97.3°F | Resp 18 | Ht 74.5 in | Wt 275.0 lb

## 2020-12-05 DIAGNOSIS — N1330 Unspecified hydronephrosis: Secondary | ICD-10-CM

## 2020-12-05 DIAGNOSIS — N35919 Unspecified urethral stricture, male, unspecified site: Secondary | ICD-10-CM

## 2020-12-05 DIAGNOSIS — R339 Retention of urine, unspecified: Secondary | ICD-10-CM

## 2020-12-05 LAB — URINALYSIS WITH REFLEX TO MICROSCOPIC
Bilirubin,Ur: NEGATIVE
Blood,UA: NEGATIVE
Ketones, UA: NEGATIVE mg/dL
Leuk Esterase,UA: NEGATIVE
Nitrite,UA: NEGATIVE
Protein,UA: NEGATIVE
Specific Gravity,UA: 1.025 (ref 1.005–1.025)
Urobilinogen,UA: 0.2 mg/dL (ref 0.2–1.0)
pH,UA: 5.5 (ref 5–7)

## 2020-12-05 LAB — POCT BLADDER SCAN PVR: Residual mL: 649 mL

## 2020-12-05 MED ORDER — TAMSULOSIN HCL 0.4 MG PO CAPS *I*
0.4000 mg | ORAL_CAPSULE | Freq: Every evening | ORAL | 2 refills | Status: DC
Start: 2020-12-05 — End: 2020-12-26

## 2020-12-05 NOTE — Progress Notes (Signed)
Dear Dr. Delaney Meigs, [place], [place],     Thank you for referring [name] to me for consultation for his left hydronephrosis. I saw [name] on 12/05/2020.     HPI:  65 y.o. yo male referred to our office for hydronephrosis  Initially seen by PCP due to RUQ pain which he describes as a superficial skin pain  He had US imaging completed which was limited  Further evaluation through CT scan showed Massive urinary bladder distention with left hydronephrosis and hydroureter without obstructing stone  Indeterminate mass of the liver  Denies any voiding concerns  Will occasionally have weak/slow stream   Reports since childhood he feels he has always had to strain to void   Denies any splitting/spraying, leaking/incontinence of urine   Feels he is able to empty bladder   Denies any nocturia   No history of enlarged prostate     Denies dysuria   Denies abdominal or flank pain currently.   Denies fever or chills currently.   Denies gross hematuria currently.   Denies recent UTI.  Denies malodorous urine currently.     No prior history of kidney stones   Denies any constipation concerns   +tobacco use  No family history of prostate, bladder, kidney, testicular cancers.   PMH significant for DM, HTN, high cholesterol, pulmonary emboli, on anticoagulation therapy     No Known Allergies (drug, envir, food or latex)    Past Medical History:   Diagnosis Date    DM (diabetes mellitus)     HTN (hypertension)     Restless leg syndrome        Past Surgical History:   Procedure Laterality Date    TONSILLECTOMY AND ADENOIDECTOMY  1969       Current Outpatient Medications   Medication Sig    clonazePAM (KLONOPIN) 1 mg tablet Take 1 mg by mouth daily    atorvastatin (LIPITOR) 10 mg tablet Take 10 mg by mouth daily    FLUoxetine (PROZAC) 10 mg tablet Take 10 mg by mouth daily    HYDROcodone-acetaminophen (NORCO) 5-325 mg per tablet TAKE 1 TABLET BY MOUTH THREE TIMES DAILY AS NEEDED FOR PAIN. MAXIMUM DAILY DOSE IS  3 TABLETS DONT TAKE ALONG WITH CLONAZEPAM    lisinopril (PRINIVIL,ZESTRIL) 5 mg tablet Take 5 mg by mouth daily    lidocaine (LIDODERM) 5 % patch PLACE 1 PATCH ONTO SKIN FOR UP TO 12 HOURS AND REMOVE FOR 12 HOURS    apixaban (ELIQUIS) 2.5 mg tablet Take 2.5 mg by mouth every 12 hours    glimepiride (AMARYL) 4 mg tablet Take 4 mg by mouth every morning    pramipexole (MIRAPEX) 1 MG tablet Take 1 mg by mouth 3 times daily    tamsulosin (FLOMAX) 0.4 mg capsule Take 1 capsule (0.4 mg total) by mouth every evening       No family history on file.    Social History     Socioeconomic History    Marital status: Married     Spouse name: Not on file    Number of children: Not on file    Years of education: Not on file    Highest education level: Not on file   Tobacco Use    Smoking status: Current Every Day Smoker     Packs/day: 0.50     Years: 57.00     Pack years: 28.50     Types: Cigarettes     Start date: 1964  Smokeless tobacco: Never Used   Substance and Sexual Activity    Alcohol use: Never    Drug use: Yes     Types: Marijuana    Sexual activity: Not Currently     Partners: Female   Other Topics Concern    Not on file   Social History Narrative    Not on file       Physical Exam:  Vitals:    12/05/20 0916   BP: (!) 173/99   Pulse: 89   Resp: 18   Temp: 36.3 C (97.3 F)   Weight: 124.7 kg (275 lb)   Height: 1.892 m (6' 2.5")     GENERAL:  No acute distress, well developed, well nourished.  NEUROLOGIC:  Oriented to person, place, time, and situation.  PSYCHIATRIC:  Normal mood and affect.  HEENT:  Normocephalic, atraumatic.  SKIN:  Normal color, turgor, texture, hydration.  RESPIRATORY:  Respirations unlabored.   HEART:  Regular rate and rhythm.  ABDOMINAL:  Abdomen soft,nontender, nondistended, and without masses. No signs of inguinal and umbilical hernias.  BACK/ORTHO:  No costovertebral angle tenderness.  No tenderness of the axial skeleton.  No obvious back deformities.  EXTREMITIES:  Without  cyanosis, clubbing, or edema. Calves nontender.  GENITOURINARY: Normal phallus with double meatus with septation.  No meatal discharge.  Cutaneous examination of phallus and scrotum unremarkable.  Bilateral testes measure approximately 4 cm in the craniocaudad axis and are without suspicious masses or tenderness.  Spermatic cord examination performed bilaterally.  Vasa deferentia noted.  No suspicious epididymal or cord masses/tenderness.  DRE: Normal anal sphincter tone without rectal masses.  Prostate  3+, smooth, symmetric, nontender, and without nodularity or induration.  Seminal vesicles nonpalpable.    Labs:  Recent Results (from the past 24 hour(s))   Urinalysis with reflex to microscopic    Collection Time: 12/05/20  9:04 AM   Result Value Ref Range    Color, UA YELLOW     Appearance,UR CLEAR     Glucose,UA 3+ 1000 MG/DL NEGATIVE mg/dL    Bilirubin,Ur NEGATIVE NEGATIVE    Ketones, UA NEGATIVE NEGATIVE mg/dL    Specific Gravity,UA 1.025 1.005 - 1.025    Blood,UA NEGATIVE NEGATIVE    pH,UA 5.5 5 - 7    Protein,UA NEGATIVE NEGATIVE    Urobilinogen,UA 0.2 0.2 - 1.0 mg/dL    Nitrite,UA NEGATIVE NEGATIVE    Leuk Esterase,UA NEGATIVE NEGATIVE   POCT Bladder Scan PVR    Collection Time: 12/05/20  9:33 AM   Result Value Ref Range    Residual mL 649 mL       Lab Results   Component Value Date    PSAR3 1.06 11/19/2020           Lab results: 11/19/20  0852   Sodium 132*   132*   132*   Potassium 4.1   4.1   4.1   Chloride 96*   96*   96*   CO2 25   25   25    UN 18   18   18    Creatinine 1.1   1.1   1.1   GFR,Caucasian 67.83   67.83   67.83   GFR,Black 82.07   82.07   82.07   Glucose 321*   321*   321*   Calcium 9.2   9.2   9.2       AUA SYMPTOM SCORE (AUASS)  During the past month...  How often have you had a  sensation of not emptying your bladder completely after you finish urinating?: More Than Half the Time  How often have you had to urinate again less than two hours after you have finished urinating?: Less Than 1  Time In 5  How often have you found you stopped and started again several times when you urinated?: Not at All  How often have you found it difficult to postpone urination?: Less Than 1 Time In 5  How often have you had a weak urinary stream?: More Than Half the Time  How often have you had to push or strain to begin urination?: Less Than Half the Time  How many times did you most typically get up each night to urinate from the time you went to bed until the time you got up in the morning?: None  AUA Symptom Total: 12  1-7 (Mild)  8-19 (Moderate)  20-35 (Severe)    QUALITY OF LIFE (QoL)  If you were to spend the rest of your life with your urinary condition just the way it is now, how would you feel about that?: Mostly Dissatified      Imaging:  11/28/2020-MR abd WWO contrast:   ABDOMEN FINDINGS:   Liver: Liver is normal in size. There is 1.3 mm cystic area seen in the segment   5/6 of liver. Hiatal hernia contrast enhancement images shows motion artifact   however venous phase shows peripheral nodular enhancement and gradual filling   possible hemangioma     Spleen: Homogenous. No focal lesions identified.     Adrenal Glands: Normal.     Gallbladder and Bile Ducts: Normal.     Pancreas: Normal.     Right Kidney: Normal.     Left Kidney: Mild hydronephrosis     Lymph Nodes: No adenopathy by imaging size criteria.     Bones: Normal.     Miscellaneous: Over distended urinary bladder.     Impression   9 mm cyst segment 6 of liver.   Over distended urinary bladder with mild left hydronephrosis.   Findings were discussed with [place], PA, on 12/05/2020 9:55 AM.      11/19/2020-CT abd/pelvis W contrast:   FINDINGS:  Normal lung bases, cardiac margins and pericardium    CT abdomen/pelvis:  Normal dimension liver, 13 mm region of diminished density, likely hemangioma, not clearly defined on earlier studies most likely due to the timing of contrast.  Consider MRI corroboration.  Normal gallbladder and biliary tree.   Normal spleen and pancreas.  Normal adrenal glands.  Normal right kidney.  Left hydronephrosis and hydroureter to the left UVJ.  No obstructing uroliths identified.  No ascites or suspicious adenopathy.  Normal dimension abdominal aorta.  Minimal atherosclerosis.  Patent branch vessels.  No bowel obstruction or acute inflammation.  Normal appendix.  Fat containing ventral hernia 27 mm opening.  There is massive distention of the urinary bladder.  No pelvic mass, adenopathy or free pelvic fluid.  No acute bony findings.    IMPRESSION:  Massive urinary bladder distention with left hydronephrosis and hydroureter without obstructing stone  Indeterminate mass segment 6 of the liver consider MRI operation peer    10/29/2020-Abd [place]:   FINDINGS:    The study is technically limited, in part due to patient body habitus.       The liver is normal in contour and there is no demonstrable mass or intrahepatic biliary ductal   dilation. The common bile duct was not well visualized.  The gallbladder is nondistended and there is no mural thickening, pericholecystic fluid, or   cholelithiasis.       The pancreas is obscured by bowel gas. The spleen measures 11.3 cm and is normal in morphology.       The right kidney is normal in contour and echotexture and there is no mass, hydronephrosis, or   stone. The left kidney is not well assessed due to overlying ribs. The technologist suspected left   hydronephrosis on real-time imaging, but this is not well depicted on the presented images.       Visualized aspects of the aorta are normal in caliber.       IMPRESSION:    Limited; negative. Consider follow-up contrast-enhanced CT to better assess the abdominal viscera.         IMPRESSION: 65 y.o. male with left hydronephrosis, urinary retention.     PLAN:  Urinalysis today in office is without concerns noted glucosuria encouraged better sugar management.  Patient continues with elevated PVR of 649  mL.  Reviewed prior CT imaging consistent with left hydronephrosis and moderate bladder distention  Patient remains asymptomatic overall  Updated renal function testing is normal  Likely longstanding history of urethral stricture which we will further evaluate through retrograde urethrogram  Prostate is enlarged on examination we will attempt further treatment with Flomax 0.4 mg daily, proper use and side effects reviewed  Encourage double voiding to ensure bladder emptying  Updated PSA testing is stable    Patient will follow up in 4 weeks with updated CT urogram prior.  Patient advised to return with a full bladder for uroflow testing at this time. Patient will call sooner with questions/concerns    Thank you for allowing to participate in your patient care. I will keep you informed regarding his future follow-up.      Sincerely,    Vena Rua, [place]    3/3/202210:37 AM

## 2020-12-10 ENCOUNTER — Telehealth: Payer: Self-pay

## 2020-12-10 NOTE — Telephone Encounter (Signed)
ct is scheduled for 3/14 @ 9am

## 2020-12-16 ENCOUNTER — Ambulatory Visit
Admission: RE | Admit: 2020-12-16 | Discharge: 2020-12-16 | Disposition: A | Discharge status: Home / Self Care | Payer: Medicaid Other | Source: Ambulatory Visit | Attending: Urology | Admitting: Urology

## 2020-12-16 DIAGNOSIS — N1330 Unspecified hydronephrosis: Secondary | ICD-10-CM | POA: Insufficient documentation

## 2020-12-16 DIAGNOSIS — K429 Umbilical hernia without obstruction or gangrene: Secondary | ICD-10-CM | POA: Insufficient documentation

## 2020-12-16 DIAGNOSIS — K5790 Diverticulosis of intestine, part unspecified, without perforation or abscess without bleeding: Secondary | ICD-10-CM | POA: Insufficient documentation

## 2020-12-16 DIAGNOSIS — N134 Hydroureter: Secondary | ICD-10-CM | POA: Insufficient documentation

## 2020-12-16 MED ORDER — IOHEXOL 350 MG/ML (OMNIPAQUE) IV SOLN 500ML BOTTLE *I*
1.0000 mL | Freq: Once | INTRAVENOUS | Status: AC
Start: 2020-12-16 — End: 2020-12-16
  Administered 2020-12-16: 09:00:00 140 mL via INTRAVENOUS

## 2020-12-23 ENCOUNTER — Other Ambulatory Visit: Payer: Self-pay

## 2020-12-23 ENCOUNTER — Telehealth: Payer: Self-pay | Admitting: Primary Care

## 2020-12-23 MED ORDER — APIXABAN 2.5 MG PO TABS *I*
2.5000 mg | ORAL_TABLET | Freq: Two times a day (BID) | ORAL | 5 refills | Status: DC
Start: 2020-12-23 — End: 2021-07-08

## 2020-12-23 NOTE — Telephone Encounter (Signed)
I do not have an opening tomorrow or Wednesday. If something opens up, we can call him.

## 2020-12-23 NOTE — Telephone Encounter (Signed)
Pt states that he slipped on the mud on Sat and now his back is messed up. Any time to over book?

## 2020-12-23 NOTE — Telephone Encounter (Signed)
eliquis pending

## 2020-12-26 ENCOUNTER — Ambulatory Visit: Payer: Medicaid Other | Attending: Primary Care | Admitting: Primary Care

## 2020-12-26 VITALS — BP 114/72 | HR 57 | Temp 98.0°F | Resp 18 | Wt 283.0 lb

## 2020-12-26 DIAGNOSIS — R208 Other disturbances of skin sensation: Secondary | ICD-10-CM | POA: Insufficient documentation

## 2020-12-26 DIAGNOSIS — M25552 Pain in left hip: Secondary | ICD-10-CM | POA: Insufficient documentation

## 2020-12-26 MED ORDER — GABAPENTIN 300 MG PO CAPSULE *I*
300.0000 mg | ORAL_CAPSULE | Freq: Every evening | ORAL | 5 refills | Status: AC
Start: 2020-12-26 — End: 2021-06-24

## 2020-12-26 NOTE — Progress Notes (Signed)
Patient Name: Craig Gilbert   Date of Service: 12/26/2020     Past Medical History:   Diagnosis Date    DM (diabetes mellitus)     HTN (hypertension)     Restless leg syndrome      Past Surgical History:   Procedure Laterality Date    TONSILLECTOMY AND ADENOIDECTOMY  1969       Current Outpatient Medications on File Prior to Visit   Medication Sig Dispense Refill    apixaban (ELIQUIS) 2.5 mg tablet Take 1 tablet (2.5 mg total) by mouth every 12 hours 60 tablet 5    clonazePAM (KLONOPIN) 1 mg tablet Take 1 mg by mouth daily      atorvastatin (LIPITOR) 10 mg tablet Take 10 mg by mouth daily      FLUoxetine (PROZAC) 10 mg tablet Take 10 mg by mouth daily      HYDROcodone-acetaminophen (NORCO) 5-325 mg per tablet TAKE 1 TABLET BY MOUTH THREE TIMES DAILY AS NEEDED FOR PAIN. MAXIMUM DAILY DOSE IS 3 TABLETS DONT TAKE ALONG WITH CLONAZEPAM      lisinopril (PRINIVIL,ZESTRIL) 5 mg tablet Take 5 mg by mouth daily      lidocaine (LIDODERM) 5 % patch PLACE 1 PATCH ONTO SKIN FOR UP TO 12 HOURS AND REMOVE FOR 12 HOURS      glimepiride (AMARYL) 4 mg tablet Take 4 mg by mouth every morning      pramipexole (MIRAPEX) 1 MG tablet Take 1 mg by mouth 3 times daily      tamsulosin (FLOMAX) 0.4 mg capsule Take 1 capsule (0.4 mg total) by mouth every evening 30 capsule 2     No current facility-administered medications on file prior to visit.      No Known Allergies (drug, envir, food or latex)    HPI: Craig Gilbert presents to the office today for an acute visit. The patient complains of back and hip pain after a fall on Saturday. He slipped on the mud and was able to catch himself before he completely landed on the ground. The patient has been using Aleve and hydrocodone that was sent earlier in the week. His pain is almost in the area where he has had sciatica before.  He has been seeing a chiropractor, but this has not been helping him.  His pain also goes into the left groin.  He is having difficulty with range of motion.  He  also notes ongoing right sided skin/abdominal pain.  Lidocaine patches were not that successful.  He has tried to get into dermatology, but has been unsuccessful.  He has been seeing urology, but did not start the tamsulosin that was prescribed.    Review of Systems   All other systems reviewed and are negative.      Physical Exam:  Vitals:    12/26/20 1410   BP: 114/72   BP Location: Left arm   Patient Position: Sitting   Pulse: 57   Resp: 18   Temp: 36.7 C (98 F)   SpO2: 98%   Weight: 128.4 kg (283 lb)     Physical Exam  Vitals and nursing note reviewed.   Constitutional:       Appearance: Normal appearance. He is obese.   Pulmonary:      Effort: Pulmonary effort is normal.   Musculoskeletal:      Lumbar back: Tenderness (left SI joint pain) present.      Left hip: Tenderness present. Decreased range of motion. Decreased strength.  Skin:     General: Skin is warm and dry.   Neurological:      Mental Status: He is alert.       Assessment/Plan:  Left hip pain  -     * Hip LEFT AP and Lateral views; Future    Skin pain    Other orders  -     gabapentin (NEURONTIN) 300 mg capsule; Take 1 capsule (300 mg total) by mouth nightly  for Neuropathic Pain    Chart, vitals, and medications are reviewed with the patient today.  Encouraged him to continue OTCs for pain relief and chiropractic care.  An x-ray is ordered given his left hip pain.  We can refer him to orthopedics following this.  He was provided the name of a dermatologist and we will trial gabapentin for his right sided abdominal skin pain.  He will call with any concerns or questions.  We will otherwise see him back at his next scheduled appointment.    [place], [place] 12/26/2020 2:50 PM      Disclosure:  Dictated with voice recognition software, due to possible errors in transcription, there may be errors in documentation.

## 2020-12-27 ENCOUNTER — Encounter: Payer: Self-pay | Admitting: Primary Care

## 2020-12-27 ENCOUNTER — Ambulatory Visit
Admission: RE | Admit: 2020-12-27 | Discharge: 2020-12-27 | Disposition: A | Discharge status: Home / Self Care | Payer: Medicaid Other | Source: Ambulatory Visit | Attending: Primary Care | Admitting: Primary Care

## 2020-12-27 DIAGNOSIS — W19XXXA Unspecified fall, initial encounter: Secondary | ICD-10-CM

## 2020-12-27 DIAGNOSIS — M1612 Unilateral primary osteoarthritis, left hip: Secondary | ICD-10-CM

## 2020-12-27 DIAGNOSIS — M25552 Pain in left hip: Secondary | ICD-10-CM | POA: Insufficient documentation

## 2020-12-30 ENCOUNTER — Telehealth: Payer: Self-pay | Admitting: Primary Care

## 2020-12-30 DIAGNOSIS — M25559 Pain in unspecified hip: Secondary | ICD-10-CM

## 2020-12-30 NOTE — Telephone Encounter (Signed)
Patient aware and would like to go to [facility].

## 2020-12-30 NOTE — Telephone Encounter (Signed)
Please advise 

## 2020-12-30 NOTE — Telephone Encounter (Signed)
-----   Message from [facility], [place] sent at 12/27/2020  1:53 PM EDT -----  Mild arthritis. Can see orthopedics and consider injection.

## 2020-12-31 ENCOUNTER — Encounter: Payer: Self-pay | Admitting: Primary Care

## 2020-12-31 MED ORDER — CLONAZEPAM 1 MG PO TABS *I*
1.0000 mg | ORAL_TABLET | Freq: Two times a day (BID) | ORAL | 0 refills | Status: DC
Start: 2020-12-31 — End: 2021-03-05

## 2020-12-31 MED ORDER — PRAMIPEXOLE DIHYDROCHLORIDE 1 MG PO TABS *I*
1.0000 mg | ORAL_TABLET | Freq: Three times a day (TID) | ORAL | 5 refills | Status: AC
Start: 2020-12-31 — End: ?

## 2020-12-31 NOTE — Telephone Encounter (Signed)
clonzapam last dispensed 11/04/20  PMP checked  Ref [id number]

## 2021-01-02 ENCOUNTER — Encounter: Payer: Self-pay | Admitting: Urology

## 2021-01-02 ENCOUNTER — Other Ambulatory Visit
Admission: RE | Admit: 2021-01-02 | Discharge: 2021-01-02 | Disposition: A | Discharge status: Home / Self Care | Payer: Medicaid Other | Source: Ambulatory Visit | Attending: Urology | Admitting: Urology

## 2021-01-02 ENCOUNTER — Ambulatory Visit: Payer: Medicaid Other | Admitting: Urology

## 2021-01-02 VITALS — BP 126/89 | HR 84 | Temp 99.3°F | Resp 20

## 2021-01-02 DIAGNOSIS — N1330 Unspecified hydronephrosis: Secondary | ICD-10-CM | POA: Insufficient documentation

## 2021-01-02 DIAGNOSIS — R339 Retention of urine, unspecified: Secondary | ICD-10-CM

## 2021-01-02 LAB — UROFLOW
Average Flowrate (Q-Ave): 1.4 ml per sec
Max Flowrate (Q-max): 4 ml per sec
Voided Volume: 102 ml

## 2021-01-02 LAB — URINALYSIS REFLEX TO CULTURE
Blood,UA: NEGATIVE
Ketones, UA: NEGATIVE
Leuk Esterase,UA: NEGATIVE
Nitrite,UA: NEGATIVE
Protein,UA: NEGATIVE
Specific Gravity,UA: 1.025 (ref 1.002–1.030)
pH,UA: 6 (ref 5.0–8.0)

## 2021-01-02 LAB — BASIC METABOLIC PANEL
Anion Gap: 10 (ref 7–16)
CO2: 25 mmol/L (ref 20–28)
Calcium: 9.3 mg/dL (ref 8.6–10.2)
Chloride: 101 mmol/L (ref 96–108)
Creatinine: 0.86 mg/dL (ref 0.67–1.17)
Glucose: 281 mg/dL — ABNORMAL HIGH (ref 60–99)
Lab: 15 mg/dL (ref 6–20)
Potassium: 3.9 mmol/L (ref 3.4–4.7)
Sodium: 136 mmol/L (ref 133–145)
eGFR BY CREAT: 97 *

## 2021-01-02 LAB — POCT BLADDER SCAN PVR: Residual mL: 897

## 2021-01-02 NOTE — Addendum Note (Signed)
Addended by: Ileene Musa on: 01/02/2021 05:32 PM     Modules accepted: Orders

## 2021-01-02 NOTE — Progress Notes (Signed)
Dear Dr. Delaney Meigs, [place], [place],     Thank you for referring [name] to me for consultation for his left hydronephrosis. I saw [name] on 01/02/2021.     HPI:  65 y.o. yo male referred to our office for hydronephrosis  Initially seen by PCP due to RUQ pain which he describes as a superficial skin pain  He had US imaging completed which was limited  Further evaluation through CT scan showed Massive urinary bladder distention with left hydronephrosis and hydroureter without obstructing stone  Indeterminate mass of the liver  Denies any voiding concerns  Will occasionally have weak/slow stream   Reports since childhood he feels he has always had to strain to void   Denies any splitting/spraying, leaking/incontinence of urine   Feels he is able to empty bladder   Denies any nocturia   No history of enlarged prostate     Interim history:   Patient presents for follow up regarding his urinary retention/hydronephrosis.   Previously started on flomax which he could not tolerate     Denies dysuria   Denies abdominal or flank pain currently.   Denies fever or chills currently.   Denies gross hematuria currently.   Denies recent UTI.  Denies malodorous urine currently.     No prior history of kidney stones   Denies any constipation concerns   +tobacco use  No family history of prostate, bladder, kidney, testicular cancers.   PMH significant for DM, HTN, high cholesterol, pulmonary emboli, on anticoagulation therapy     No Known Allergies (drug, envir, food or latex)    Past Medical History:   Diagnosis Date    DM (diabetes mellitus)     HTN (hypertension)     Restless leg syndrome        Past Surgical History:   Procedure Laterality Date    TONSILLECTOMY AND ADENOIDECTOMY  1969       Current Outpatient Medications   Medication Sig    clonazePAM (KLONOPIN) 1 mg tablet Take 1 tablet (1 mg total) by mouth 2 times daily  Max daily dose: 2 mg    pramipexole (MIRAPEX) 1 MG tablet Take 1 tablet (1 mg  total) by mouth 3 times daily    gabapentin (NEURONTIN) 300 mg capsule Take 1 capsule (300 mg total) by mouth nightly  for Neuropathic Pain    apixaban (ELIQUIS) 2.5 mg tablet Take 1 tablet (2.5 mg total) by mouth every 12 hours    atorvastatin (LIPITOR) 10 mg tablet Take 10 mg by mouth daily    FLUoxetine (PROZAC) 10 mg tablet Take 10 mg by mouth daily    lisinopril (PRINIVIL,ZESTRIL) 5 mg tablet Take 5 mg by mouth daily    glimepiride (AMARYL) 4 mg tablet Take 4 mg by mouth every morning       History reviewed. No pertinent family history.    Social History     Socioeconomic History    Marital status: Married     Spouse name: Not on file    Number of children: Not on file    Years of education: Not on file    Highest education level: Not on file   Tobacco Use    Smoking status: Current Every Day Smoker     Packs/day: 0.50     Years: 57.00     Pack years: 28.50     Types: Cigarettes     Start date: 1964    Smokeless tobacco: Never  Used   Substance and Sexual Activity    Alcohol use: Never    Drug use: Yes     Types: Marijuana    Sexual activity: Not Currently     Partners: Female   Other Topics Concern    Not on file   Social History Narrative    Not on file       Physical Exam:  There were no vitals filed for this visit.  GENERAL:  No acute distress, well developed, well nourished.  NEUROLOGIC:  Oriented to person, place, time, and situation.  PSYCHIATRIC:  Normal mood and affect.  HEENT:  Normocephalic, atraumatic.  SKIN:  Normal color, turgor, texture, hydration.  RESPIRATORY:  Respirations unlabored.   HEART:  Regular rate and rhythm.  ABDOMINAL:  Abdomen soft,nontender, nondistended, and without masses. No signs of inguinal and umbilical hernias.  BACK/ORTHO:  No costovertebral angle tenderness.  No tenderness of the axial skeleton.  No obvious back deformities.  EXTREMITIES:  Without cyanosis, clubbing, or edema. Calves nontender.    Labs:  No results found for this or any previous visit  (from the past 24 hour(s)).    Lab Results   Component Value Date    PSAR3 1.06 11/19/2020           Lab results: 11/19/20  0852   Sodium 132*   132*   132*   Potassium 4.1   4.1   4.1   Chloride 96*   96*   96*   CO2 25   25   25    UN 18   18   18    Creatinine 1.1   1.1   1.1   GFR,Caucasian 67.83   67.83   67.83   GFR,Black 82.07   82.07   82.07   Glucose 321*   321*   321*   Calcium 9.2   9.2   9.2     Uroflow Testing: 01/02/2021  Voided Volume-102 mL   Peak Flow Rate-4.0 mL/S   Voiding Time-99.6S  PVR-874mL    AUA SYMPTOM SCORE (AUASS)  During the past month...  How often have you had a sensation of not emptying your bladder completely after you finish urinating?: Less Than 1 Time In 5  How often have you had to urinate again less than two hours after you have finished urinating?: Less Than Half the Time  How often have you found you stopped and started again several times when you urinated?: Less Than Half the Time  How often have you found it difficult to postpone urination?: Less Than Half the Time  How often have you had a weak urinary stream?: Almost Always  How often have you had to push or strain to begin urination?: Less Than Half the Time  How many times did you most typically get up each night to urinate from the time you went to bed until the time you got up in the morning?: None  AUA Symptom Total: 14  1-7 (Mild)  8-19 (Moderate)  20-35 (Severe)    QUALITY OF LIFE (QoL)  If you were to spend the rest of your life with your urinary condition just the way it is now, how would you feel about that?: Mostly Satisfied      Imaging:  12/16/2020-CT abd/pelvis WWO contrast:   FINDINGS:   Lung Bases: Unremarkable     Stomach: Unremarkable     Liver: Homogenous. Small segment 6 hepatic hypodensity, statistically most likely representing cysts versus hemangioma.  Spleen: Homogenous. No focal lesions identified.     Adrenal Glands: No discrete adrenal nodule     Gallbladder and Bile Ducts: Unremarkable      Pancreas: Unremarkable     Right Kidney: Unremarkable     Left Kidney: Moderate to large left hydronephrosis and hydroureter with delayed imaging demonstrated urine-contrast level within the dilated left renal pelvis and left-sided renal calyces, no contrast within the left ureter. No ureteral or bladder   calculus evident.     Vasculature: Mild atherosclerotic calcification. No aneurysmal dilatation     Small Bowel: The small bowel is not distended.     Colon: No colonic wall thickening is appreciated. Small stool throughout the colon, greater on the right. Colonic diverticula of the sigmoid and distal descending colon without pericolonic induration.     Lymph Nodes: No adenopathy by imaging size criteria.     Bladder: Normal.     Prostate: Normal.     Soft Tissues: Periumbilical hernia containing fat with os of 2.4 cm and measuring up to 6.2 cm transversely     Bones: Degenerative changes of the thoracal lumbar spine with partial ankylosis of the bilateral sacroiliac joints     Miscellaneous: No free fluid or ascites.      Impression   1. No significant change in moderate left hydronephrosis and hydroureter with urine-contrast level in the left dilated renal pelvis. No renal, ureteral or bladder calculi evident. Findings may be related to underlying stricture/lesion or nonradiopaque   calculus. Correlation with direct visualization might be considered.   2. No small bowel obstruction. Diverticulosis without diverticulitis. Periumbilical hernia.        11/28/2020-MR abd WWO contrast:   ABDOMEN FINDINGS:   Liver: Liver is normal in size. There is 1.3 mm cystic area seen in the segment   5/6 of liver. Hiatal hernia contrast enhancement images shows motion artifact   however venous phase shows peripheral nodular enhancement and gradual filling   possible hemangioma     Spleen: Homogenous. No focal lesions identified.     Adrenal Glands: Normal.     Gallbladder and Bile Ducts: Normal.     Pancreas: Normal.     Right  Kidney: Normal.     Left Kidney: Mild hydronephrosis     Lymph Nodes: No adenopathy by imaging size criteria.     Bones: Normal.     Miscellaneous: Over distended urinary bladder.     Impression   9 mm cyst segment 6 of liver.   Over distended urinary bladder with mild left hydronephrosis.   Findings were discussed with Ovando, PA, on 12/05/2020 9:55 AM.      11/19/2020-CT abd/pelvis W contrast:   FINDINGS:  Normal lung bases, cardiac margins and pericardium    CT abdomen/pelvis:  Normal dimension liver, 13 mm region of diminished density, likely hemangioma, not clearly defined on earlier studies most likely due to the timing of contrast.  Consider MRI corroboration.  Normal gallbladder and biliary tree.  Normal spleen and pancreas.  Normal adrenal glands.  Normal right kidney.  Left hydronephrosis and hydroureter to the left UVJ.  No obstructing uroliths identified.  No ascites or suspicious adenopathy.  Normal dimension abdominal aorta.  Minimal atherosclerosis.  Patent branch vessels.  No bowel obstruction or acute inflammation.  Normal appendix.  Fat containing ventral hernia 27 mm opening.  There is massive distention of the urinary bladder.  No pelvic mass, adenopathy or free pelvic fluid.  No acute bony findings.  IMPRESSION:  Massive urinary bladder distention with left hydronephrosis and hydroureter without obstructing stone  Indeterminate mass segment 6 of the liver consider MRI operation peer    10/29/2020-Abd [place]:   FINDINGS:    The study is technically limited, in part due to patient body habitus.       The liver is normal in contour and there is no demonstrable mass or intrahepatic biliary ductal   dilation. The common bile duct was not well visualized.       The gallbladder is nondistended and there is no mural thickening, pericholecystic fluid, or   cholelithiasis.       The pancreas is obscured by bowel gas. The spleen measures 11.3 cm and is normal in morphology.        The right kidney is normal in contour and echotexture and there is no mass, hydronephrosis, or   stone. The left kidney is not well assessed due to overlying ribs. The technologist suspected left   hydronephrosis on real-time imaging, but this is not well depicted on the presented images.       Visualized aspects of the aorta are normal in caliber.       IMPRESSION:    Limited; negative. Consider follow-up contrast-enhanced CT to better assess the abdominal viscera.         IMPRESSION: 65 y.o. male with left hydronephrosis.  Prostate enlargement. Urinary retention.     PLAN:  Limited uroflow testing today in office however mostly consistent with obstruction questionable straining pattern concerning for detrusor muscle dysfunction.    Total voided volume of 102 mL, peak flow rate of 4.L mL/S and voiding time of 99.6s  Patient continues with elevated PVR of 897 mL  Reviewed updated CT urogram again consistent with moderate to severe left hydronephrosis  Patient remains asymptomatic overall  Prior renal function showed normal labs  Again discussed Foley catheter placement versus CIC therapy  Patient refusing at this time  We will further evaluate through cystoscopy  Prior exam findings concerning for prostate enlargement previously attempted treatment with Flomax however patient could not tolerate  Again encourage double voiding  Updated PSA testing is stable  Patient will update renal function labs today, I will call with concerns   He will tentatively follow-up in 6 weeks with renal ultrasound prior.  Patient will call sooner with questions/concerns.    Thank you for allowing to participate in your patient care. I will keep you informed regarding his future follow-up.      Sincerely,    Vena Rua, [place]    3/31/202212:56 PM

## 2021-01-02 NOTE — Addendum Note (Signed)
Addended by: Ileene Musa on: 01/02/2021 05:45 PM     Modules accepted: Orders

## 2021-01-02 NOTE — Patient Instructions (Addendum)
Procedure Date: Tuesday April 5th    PRIOR TO PROCEDURE  Call OR one day prior to procedure to receive arrival time.   [facility]: [phone].   Address: [address]  [place], [place]

## 2021-01-07 ENCOUNTER — Ambulatory Visit
Admission: RE | Admit: 2021-01-07 | Discharge: 2021-01-07 | Disposition: A | Discharge status: Home / Self Care | Payer: Medicaid Other | Source: Ambulatory Visit | Attending: Urology | Admitting: Urology

## 2021-01-07 ENCOUNTER — Encounter: Payer: Self-pay | Admitting: Urology

## 2021-01-07 ENCOUNTER — Encounter: Payer: Self-pay | Admitting: Gastroenterology

## 2021-01-07 ENCOUNTER — Encounter
Admission: RE | Disposition: A | Discharge status: Home / Self Care | Payer: Self-pay | Source: Ambulatory Visit | Attending: Urology

## 2021-01-07 ENCOUNTER — Encounter: Payer: Self-pay | Admitting: Certified Registered"

## 2021-01-07 ENCOUNTER — Encounter: Payer: Medicaid Other | Admitting: Urology

## 2021-01-07 ENCOUNTER — Telehealth: Payer: Self-pay | Admitting: Urology

## 2021-01-07 DIAGNOSIS — R339 Retention of urine, unspecified: Secondary | ICD-10-CM | POA: Insufficient documentation

## 2021-01-07 DIAGNOSIS — N35919 Unspecified urethral stricture, male, unspecified site: Secondary | ICD-10-CM

## 2021-01-07 DIAGNOSIS — Z7901 Long term (current) use of anticoagulants: Secondary | ICD-10-CM | POA: Insufficient documentation

## 2021-01-07 DIAGNOSIS — F17210 Nicotine dependence, cigarettes, uncomplicated: Secondary | ICD-10-CM | POA: Insufficient documentation

## 2021-01-07 DIAGNOSIS — E119 Type 2 diabetes mellitus without complications: Secondary | ICD-10-CM | POA: Insufficient documentation

## 2021-01-07 DIAGNOSIS — I10 Essential (primary) hypertension: Secondary | ICD-10-CM | POA: Insufficient documentation

## 2021-01-07 DIAGNOSIS — G2581 Restless legs syndrome: Secondary | ICD-10-CM | POA: Insufficient documentation

## 2021-01-07 DIAGNOSIS — N35912 Unspecified bulbous urethral stricture, male: Secondary | ICD-10-CM

## 2021-01-07 DIAGNOSIS — N1330 Unspecified hydronephrosis: Secondary | ICD-10-CM

## 2021-01-07 HISTORY — PX: PR CYSTOURETHROSCOPY: 52000

## 2021-01-07 SURGERY — CYSTOSCOPY
Anesthesia: Local | Site: Pelvis | Wound class: Clean Contaminated

## 2021-01-07 MED ORDER — LIDOCAINE HCL 2 % UROJET *I*
CUTANEOUS | Status: DC | PRN
Start: 2021-01-07 — End: 2021-01-07
  Administered 2021-01-07: 11 mL via URETHRAL

## 2021-01-07 MED ORDER — LIDOCAINE HCL 2 % EX JELLY SYRINGE *I*
CUTANEOUS | Status: AC
Start: 2021-01-07 — End: 2021-01-07
  Filled 2021-01-07: qty 11

## 2021-01-07 SURGICAL SUPPLY — 29 items
BAG DRAIN W W/SPOUT 2000ML (Supply) IMPLANT
BEDSIDE CLEANING KIT (Supply) ×3 IMPLANT
CATH FOLEY SIL 5CC 22FR (Catheter) IMPLANT
COVER TABLE REINF POLY 44 X 90IN (Other) ×3 IMPLANT
DRAPE SURG FAN-FOLD 44 X 58IN (Drape) ×3 IMPLANT
DRESSING TELFA 4 IN X 3 IN (Dressing) IMPLANT
ELECTRODE BUGBEE FULGUR (Other) IMPLANT
GLOVE SURG PROTEXIS PI 6.5 PF SYN (Glove) ×3 IMPLANT
GLOVE SURG PROTEXIS PI 7.5 PF SYN (Glove) ×3 IMPLANT
HIBICLENS LIQUID FLIP-TOP 4OZ (Supply) ×3 IMPLANT
LUBRICATING JELLY - 3 GRAM (Supply) ×3 IMPLANT
NEEDLE HYPO REG BVL 18G X 1IN PINK (Needle) IMPLANT
PAD GROUNDING ADULT SURE FIT (Supply) IMPLANT
SEAL PORT BIOPSY Y-ADAPTER (Supply) IMPLANT
SOL GLYCINE IRRIG 1.5PCT 3000ML BAG (Solution) IMPLANT
SOL SOD CHL IV .9PCT 1000ML BAG (Drug) ×3 IMPLANT
SPONGE VERSALON 4X4 STER (Sponge) IMPLANT
STERILE DISP BOWL 32OZ (Supply) IMPLANT
STOPCOCK 3 WAY MALE (Supply) ×2
STOPCOCK IV LG BOR 3 GANG 3 W STPCOCK W/ EXT M LL LIP RESIST HI-FLO (Supply) ×1 IMPLANT
SYRINGE 10CC LUERLOCK LF (Syringe)
SYRINGE LUERLOCK 10ML INDIVIDUAL WRAP (Syringe) IMPLANT
SYRINGE LUERLOCK 30ML INDIVIDUAL WRAP (Syringe) ×2 IMPLANT
SYRINGE ONLY 30ML LUER-LOK (Syringe) ×1
TOWEL SURG 17 X 27IN BLUE STER DISP (Towel) ×3 IMPLANT
TRAY SURGICAL SCRUB SKIN DRY VINYL 4 COMPARTMENT PREMIUM (Tray) ×3 IMPLANT
TUBE IRRIGATION CYSTO GRAV 80IN LG LF (Supply) ×3 IMPLANT
TUBING SUCTION STERILE 7MM (Supply) IMPLANT
TUBING SUCTION STERILE 7mm (Supply)

## 2021-01-07 NOTE — Telephone Encounter (Signed)
We performed urethroscopy today .    Plan  Follow up with urethrogram

## 2021-01-07 NOTE — Op Note (Addendum)
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        Fluid Totals: No intake/output data recorded.       Estimated Blood Loss: * No values recorded between 01/07/2021 12:00 AM and 01/07/2021  2:27 PM *       Specimens to Pathology:  * No specimens in log *       Temporary Implants:        Packing:             Transplant: N/A   Patient Condition: good       Indications: Craig Gilbert is a 65 y.o. 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 Lidocaine jelly with instilled in the penile urethra and a penile clamp was place for approx. 5 min. His penis was then prepped and draped in the usual fashion. The flexible scope was advance into the penile urethra without difficult.  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 Did not allow flexible cystoscope.    Plan  Follow up with urethrogram      Signed:  Lowella Dandy, MD  on 01/07/2021 at 2:27 PM

## 2021-01-07 NOTE — Discharge Instructions (Addendum)
Urology Discharge Instructions  Dr. Izetta Dakin  [phone]     Discharge/Post-operative Instructions: Urethroscopy     Diet: you may resume your normal diet. Increase water intake will help to clear urine. Activity: you may resume your normal activities.  Aspirin/Anticoagulants: you may resume your aspirin/anticoagulant medication 24 hours after your urine remains clear. If your urine is still bloody 72 hours after the procedure, please call the office in regards to resuming your medication.    If you notice any of the following problems, call Dr. Izetta Dakin right away at [phone] or contact the Emergency department at [phone].     Fever greater than 101F   Pain not relieved by Tylenol   Swelling or redness   Heavy bleeding, blood clots in urine   Foul drainage   Inability to urinate    Follow with urethrogram

## 2021-01-09 ENCOUNTER — Other Ambulatory Visit: Payer: Self-pay | Admitting: Primary Care

## 2021-01-09 NOTE — Telephone Encounter (Signed)
Lisinopril pending

## 2021-01-10 ENCOUNTER — Encounter: Payer: Self-pay | Admitting: Primary Care

## 2021-01-10 MED ORDER — GLIMEPIRIDE 4 MG PO TABS *I*
4.0000 mg | ORAL_TABLET | Freq: Every morning | ORAL | 1 refills | Status: DC
Start: 2021-01-10 — End: 2021-07-08

## 2021-01-10 NOTE — Telephone Encounter (Signed)
Craig Gilbert patient. Glimepiride pending.

## 2021-01-18 ENCOUNTER — Encounter: Payer: Self-pay | Admitting: Primary Care

## 2021-02-11 ENCOUNTER — Emergency Department
Admission: EM | Admit: 2021-02-11 | Discharge: 2021-02-11 | Disposition: A | Discharge status: Home / Self Care | Payer: Medicaid Other | Source: Ambulatory Visit | Attending: Critical Care Medicine | Admitting: Critical Care Medicine

## 2021-02-11 ENCOUNTER — Encounter: Payer: Self-pay | Admitting: Urgent Care

## 2021-02-11 ENCOUNTER — Ambulatory Visit
Admission: RE | Admit: 2021-02-11 | Discharge: 2021-02-11 | Disposition: A | Discharge status: Home / Self Care | Payer: Medicaid Other | Source: Ambulatory Visit | Attending: Urology | Admitting: Urology

## 2021-02-11 DIAGNOSIS — M5442 Lumbago with sciatica, left side: Secondary | ICD-10-CM | POA: Insufficient documentation

## 2021-02-11 DIAGNOSIS — N1330 Unspecified hydronephrosis: Secondary | ICD-10-CM | POA: Insufficient documentation

## 2021-02-11 DIAGNOSIS — R202 Paresthesia of skin: Secondary | ICD-10-CM

## 2021-02-11 DIAGNOSIS — N1339 Other hydronephrosis: Secondary | ICD-10-CM | POA: Insufficient documentation

## 2021-02-11 DIAGNOSIS — R200 Anesthesia of skin: Secondary | ICD-10-CM

## 2021-02-11 DIAGNOSIS — M5432 Sciatica, left side: Secondary | ICD-10-CM

## 2021-02-11 DIAGNOSIS — R339 Retention of urine, unspecified: Secondary | ICD-10-CM | POA: Insufficient documentation

## 2021-02-11 MED ORDER — METHYLPREDNISOLONE SOD SUCC 125 MG IJ SOLR(62.5MG/ML) *WRAPPED*
125.0000 mg | Freq: Once | INTRAMUSCULAR | Status: AC
Start: 2021-02-11 — End: 2021-02-11
  Administered 2021-02-11: 125 mg via INTRAVENOUS
  Filled 2021-02-11: qty 2

## 2021-02-11 MED ORDER — KETOROLAC TROMETHAMINE 30 MG/ML IJ SOLN *I*
30.0000 mg | Freq: Once | INTRAMUSCULAR | Status: AC
Start: 2021-02-11 — End: 2021-02-11
  Administered 2021-02-11: 30 mg via INTRAVENOUS
  Filled 2021-02-11: qty 1

## 2021-02-11 MED ORDER — METHYLPREDNISOLONE 4 MG PO TBPK *A*
ORAL_TABLET | ORAL | 0 refills | Status: DC
Start: 2021-02-11 — End: 2021-07-08

## 2021-02-11 MED ORDER — BACLOFEN 10 MG PO TABS *I*
10.0000 mg | ORAL_TABLET | Freq: Three times a day (TID) | ORAL | 0 refills | Status: AC
Start: 2021-02-11 — End: 2021-02-21

## 2021-02-11 MED ORDER — BACLOFEN 10 MG PO TABS *I*
10.0000 mg | ORAL_TABLET | Freq: Once | ORAL | Status: AC
Start: 2021-02-11 — End: 2021-02-11
  Administered 2021-02-11: 10 mg via ORAL
  Filled 2021-02-11: qty 1

## 2021-02-11 NOTE — ED Provider Notes (Signed)
History     Chief Complaint   Patient presents with    Back Pain     Craig Gilbert is a 65 year old male, who has a medical history of Diabetes Mellitus, HTN, Restless Leg Syndrome, and Sciatica.  For the past 4-6 weeks, has had left sciatic nerve pain, radiating down left leg, to left foot, with numbness and tingling of left leg.  Denies any recent injury or trauma to back. Has seen his Chiropractor and Primary Care Provider for the pain.  Has an appointment with Urology in [facility] tomorrow for Urinary Retention.  Denies any difficulty moving bowels.  Denies chest pain, SOB, N/V, fever, or chills.          Medical/Surgical/Family History     Past Medical History:   Diagnosis Date    DM (diabetes mellitus)     HTN (hypertension)     Restless leg syndrome         There is no problem list on file for this patient.           Past Surgical History:   Procedure Laterality Date    PR CYSTOURETHROSCOPY N/A 01/07/2021    Procedure: urethroscopy;  Surgeon: Lowella Dandy, MD;  Location: [facility] MAIN OR;  Service: Urology    [place]     History reviewed. No pertinent family history.       Social History     Tobacco Use    Smoking status: Current Every Day Smoker     Packs/day: 0.50     Years: 57.00     Pack years: 28.50     Types: Cigarettes     Start date: 1964    Smokeless tobacco: Never Used   Substance Use Topics    Alcohol use: Never    Drug use: Yes     Types: Marijuana     Living Situation     Questions Responses    Patient lives with     Homeless     Caregiver for other family member     External Services     Employment     Domestic Violence Risk                 Review of Systems   Review of Systems   Constitutional: Negative.    HENT: Negative.    Eyes: Negative.    Respiratory: Negative.    Cardiovascular: Negative.    Gastrointestinal: Negative.    Endocrine: Negative.    Genitourinary: Negative.    Musculoskeletal: Positive for back pain.        Pain left buttock to left  leg, with numbness and tingling of  Left leg.  Pain radiates from left buttock to left foot.   Skin: Negative.    Allergic/Immunologic: Negative.    Neurological: Positive for numbness.        Left leg numbness and tingling   Hematological: Negative.    Psychiatric/Behavioral: Negative.    All other systems reviewed and are negative.      Physical Exam     Triage Vitals  Triage Start: Start, (02/11/21 0743)   First Recorded BP: 132/84, Resp: 16, Temp: 36.3 C (97.3 F), Temp src: Tympanic Oxygen Therapy SpO2: 99 %, Heart Rate: 87, (02/11/21 0744)  .  First Pain Reported  0-10 Scale: 8, (02/11/21 2831)       Physical Exam  Vitals and nursing note reviewed.   Constitutional:  Appearance: Normal appearance.   Musculoskeletal:      Comments: Decreased movement of back and left leg, due to pain.   Skin:     General: Skin is warm and dry.   Neurological:      Mental Status: He is alert and oriented to person, place, and time.   Psychiatric:         Mood and Affect: Mood normal.         Behavior: Behavior normal.         Medical Decision Making     Assessment:  Craig Gilbert is a 65 year old male, who has a medical history of Diabetes Mellitus, HTN, Restless Leg Syndrome, and Sciatica.  For the past 4-6 weeks, has had left sciatic nerve pain, radiating down left leg, to left foot, with numbness and tingling of left leg.  Denies any recent injury or trauma to back. Has seen his Chiropractor and Primary Care Provider for the pain.  Has an appointment with Urology in [facility] tomorrow for Urinary Retention.  Denies any difficulty moving bowels.  Denies chest pain, SOB, N/V, fever, or chills.      Differential diagnosis:  Left sciatica.      Plan:  Discharge home after receiving Baclofen 10 gm po, Solumedrol 125 mg IV, and Toradol 30 mg IV.  Already following up with Chiropractor, and Primary Care Provider for sciatic nerve pain.    ED Course and Disposition:  DIAGNOSIS: Left Sciatica  PLAN: Discharge home with  prescription for Baclofen and Medrol DosePak            Shanna Cisco, NP          Shanna Cisco, NP  02/11/21 [phone]

## 2021-02-11 NOTE — Discharge Instructions (Signed)
Baclofen 10 mg by mouth every 8 hours, for 10 days.  Follow up with Primary Care Provider next week.  Ibuprofen, Tylenol as needed for discomfort.  Medrol DosePak as directed.

## 2021-02-11 NOTE — ED Triage Notes (Signed)
Patient with lower back pain and pain down into left leg. Patient recalls this feels like his previous sciatic pain. Denies any injury, does have feeling of tingling into left foot and leg.           Prehospital medications given: No

## 2021-02-12 ENCOUNTER — Ambulatory Visit
Admission: RE | Admit: 2021-02-12 | Discharge: 2021-02-12 | Disposition: A | Discharge status: Home / Self Care | Payer: Medicaid Other | Source: Ambulatory Visit | Attending: Urology | Admitting: Urology

## 2021-02-12 DIAGNOSIS — N35919 Unspecified urethral stricture, male, unspecified site: Secondary | ICD-10-CM | POA: Insufficient documentation

## 2021-02-18 ENCOUNTER — Ambulatory Visit: Payer: Medicaid Other | Admitting: Urology

## 2021-02-18 ENCOUNTER — Encounter: Payer: Self-pay | Admitting: Urology

## 2021-02-18 ENCOUNTER — Other Ambulatory Visit
Admission: RE | Admit: 2021-02-18 | Discharge: 2021-02-18 | Disposition: A | Discharge status: Home / Self Care | Payer: Medicaid Other | Source: Ambulatory Visit | Attending: Urology | Admitting: Urology

## 2021-02-18 VITALS — BP 126/86 | HR 107 | Temp 96.1°F | Resp 18 | Ht 74.0 in | Wt 275.0 lb

## 2021-02-18 DIAGNOSIS — N1330 Unspecified hydronephrosis: Secondary | ICD-10-CM | POA: Insufficient documentation

## 2021-02-18 DIAGNOSIS — R339 Retention of urine, unspecified: Secondary | ICD-10-CM

## 2021-02-18 LAB — URINALYSIS REFLEX TO CULTURE
Blood,UA: NEGATIVE
Ketones, UA: NEGATIVE
Leuk Esterase,UA: NEGATIVE
Nitrite,UA: NEGATIVE
Protein,UA: NEGATIVE
Specific Gravity,UA: 1.015 (ref 1.002–1.030)
pH,UA: 5.5 (ref 5.0–8.0)

## 2021-02-18 LAB — POCT BLADDER SCAN PVR: Residual mL: 798

## 2021-02-18 NOTE — Progress Notes (Signed)
Dear Dr. Delaney Gilbert, Craig Gilbert, [place],     Thank you for referring [name] to me for consultation for his left hydronephrosis. I saw [name] on 02/18/2021.     HPI:  65 y.o. yo male referred to our office for hydronephrosis  Initially seen by PCP due to RUQ pain which he describes as a superficial skin pain  He had US imaging completed which was limited  Further evaluation through CT scan showed Massive urinary bladder distention with left hydronephrosis and hydroureter without obstructing stone  Indeterminate mass of the liver  Denies any voiding concerns  Will occasionally have weak/slow stream   Reports since childhood he feels he has always had to strain to void   Denies any splitting/spraying, leaking/incontinence of urine   Feels he is able to empty bladder   Denies any nocturia   No history of enlarged prostate     Interim history:   Patient presents for follow up regarding his urinary retention/hydronephrosis.   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   Denies fever or chills currently.   Denies gross hematuria currently.   Denies recent UTI.  Denies malodorous urine currently.     No prior history of kidney stones   Denies any constipation concerns   +tobacco use  No family history of prostate, bladder, kidney, testicular cancers.   PMH significant for DM, HTN, high cholesterol, pulmonary emboli, on anticoagulation therapy     No Known Allergies (drug, envir, food or latex)    Past Medical History:   Diagnosis Date    DM (diabetes mellitus)     HTN (hypertension)     Restless leg syndrome        Past Surgical History:   Procedure Laterality Date    PR CYSTOURETHROSCOPY N/A 01/07/2021    Procedure: urethroscopy;  Surgeon: Lowella Dandy, MD;  Location: [facility] MAIN OR;  Service:  Urology    TONSILLECTOMY AND ADENOIDECTOMY  1969       Current Outpatient Medications   Medication Sig    baclofen (LIORESAL) 10 mg tablet Take 1 tablet (10 mg total) by mouth every 8 hours for 10 days    methylPREDNISolone (MEDROL PAK) 4 MG tablet pack Take according to package directions (6 day supply)    glimepiride (AMARYL) 4 mg tablet Take 1 tablet (4 mg total) by mouth every morning    lisinopril (PRINIVIL,ZESTRIL) 5 mg tablet TAKE 1 TABLET BY MOUTH DAILY    clonazePAM (KLONOPIN) 1 mg tablet Take 1 tablet (1 mg total) by mouth 2 times daily  Max daily dose: 2 mg    pramipexole (MIRAPEX) 1 MG tablet Take 1 tablet (1 mg total) by mouth 3 times daily    gabapentin (NEURONTIN) 300 mg capsule Take 1 capsule (300 mg total) by mouth nightly  for Neuropathic Pain    apixaban (ELIQUIS) 2.5 mg tablet Take 1 tablet (2.5 mg total) by mouth every 12 hours    atorvastatin (LIPITOR) 10 mg tablet Take 10 mg by mouth daily    FLUoxetine (PROZAC) 10 mg tablet Take 10 mg by mouth daily       History reviewed. No pertinent family history.    Social History     Socioeconomic History    Marital status:  Married     Spouse name: Not on file    Number of children: Not on file    Years of education: Not on file    Highest education level: Not on file   Tobacco Use    Smoking status: Current Every Day Smoker     Packs/day: 0.50     Years: 57.00     Pack years: 28.50     Types: Cigarettes     Start date: 1964    Smokeless tobacco: Never Used   Substance and Sexual Activity    Alcohol use: Never    Drug use: Yes     Types: Marijuana    Sexual activity: Not Currently     Partners: Female   Other Topics Concern    Not on file   Social History Narrative    Not on file       Physical Exam:  Vitals:    02/18/21 1048   BP: 126/86   Pulse: 107   Resp: 18   Temp: 35.6 C (96.1 F)   Weight: 124.7 kg (275 lb)   Height: 1.88 m (6\' 2" )     GENERAL:  No acute distress, well developed, well nourished.  NEUROLOGIC:  Oriented to  person, place, time, and situation.  PSYCHIATRIC:  Normal mood and affect.  HEENT:  Normocephalic, atraumatic.  SKIN:  Normal color, turgor, texture, hydration.  RESPIRATORY:  Respirations unlabored.   HEART:  Regular rate and rhythm.  ABDOMINAL:  Abdomen soft,nontender, nondistended, and without masses. No signs of inguinal and umbilical hernias.  BACK/ORTHO:  No costovertebral angle tenderness.  No tenderness of the axial skeleton.  No obvious back deformities.  EXTREMITIES:  Without cyanosis, clubbing, or edema. Calves nontender.    Labs:  Recent Results (from the past 24 hour(s))   POCT Bladder Scan PVR    Collection Time: 02/18/21 11:00 AM   Result Value Ref Range    Residual mL 798 ml    Urinalysis reflex to culture    Collection Time: 02/18/21 11:47 AM    Specimen: Urine (Clean catch, voided, midstream)   Result Value Ref Range    Color, UA Yellow Yellow-Dk Yellow    Appearance,UR Clear Clear    Specific Gravity,UA 1.015 1.002 - 1.030    Leuk Esterase,UA NEG NEGATIVE    Nitrite,UA NEG NEGATIVE    pH,UA 5.5 5.0 - 8.0    Protein,UA NEG NEGATIVE    Glucose,UA 3+ (!)     Ketones, UA NEG NEGATIVE    Blood,UA NEG NEGATIVE       Lab Results   Component Value Date    PSAR3 1.06 11/19/2020     Hemoglobin A1C   Date Value Ref Range Status   11/19/2020 10.4 (H) 4.3 - 5.8 % Final           Lab results: 01/02/21  1233 11/19/20  0852   Sodium 136 132*   132*   132*   Potassium 3.9 4.1   4.1   4.1   Chloride 101 96*   96*   96*   CO2 25 25   25   25    UN 15 18   18   18    Creatinine 0.86 1.1   1.1   1.1   GFR,Caucasian  --  67.83   67.83   67.83   GFR,Black  --  82.07   82.07   82.07   Glucose 281* 321*   321*   321*  Calcium 9.3 9.2   9.2   9.2     Uroflow Testing: 01/02/2021  Voided Volume-102 mL   Peak Flow Rate-4.0 mL/S   Voiding Time-99.6S  PVR-823mL    AUA SYMPTOM SCORE (AUASS)  During the past month...  How often have you had a sensation of not emptying your bladder completely after you finish urinating?: Not at  All  How often have you had to urinate again less than two hours after you have finished urinating?: About Half the Time  How often have you found you stopped and started again several times when you urinated?: Not at All  How often have you found it difficult to postpone urination?: Almost Always  How often have you had a weak urinary stream?: Almost Always  How often have you had to push or strain to begin urination?: Not at All  How many times did you most typically get up each night to urinate from the time you went to bed until the time you got up in the morning?: None  AUA Symptom Total: 13  1-7 (Mild)  8-19 (Moderate)  20-35 (Severe)    QUALITY OF LIFE (QoL)  If you were to spend the rest of your life with your urinary condition just the way it is now, how would you feel about that?: Mostly Dissatified    01/07/2021-Urethroscopy:   The flexible scope was advance into the penile urethra without difficult.  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 Did not allow flexible cystoscope.    Imaging:  02/12/2021-Renal US:   FINDINGS:   Right Kidney: 12.1 cm   Echogenicity: Normal.   Hydronephrosis: None.   Calculi: None   Focal Lesions: None.     Left Kidney: 12.4 cm   Echogenicity: Normal.   Hydronephrosis: Moderate similar to prior   Calculi: None   Focal Lesions: None.     Bladder: Anatomically normal appearance. Ureteral jets demonstrated.     Impression   Moderate left hydronephrosis, normal right kidney and bladder       02/12/2021-Retrograde Urethrogram:   FINDINGS and IMPRESSION:   Retrograde urethrogram was performed and it demonstrates a small stricture at the bulbar urethra.     12/16/2020-CT abd/pelvis WWO contrast:   FINDINGS:   Lung Bases: Unremarkable     Stomach: Unremarkable     Liver: Homogenous. Small segment 6 hepatic hypodensity, statistically most likely representing cysts versus hemangioma.     Spleen: Homogenous. No focal lesions identified.     Adrenal Glands: No  discrete adrenal nodule     Gallbladder and Bile Ducts: Unremarkable     Pancreas: Unremarkable     Right Kidney: Unremarkable     Left Kidney: Moderate to large left hydronephrosis and hydroureter with delayed imaging demonstrated urine-contrast level within the dilated left renal pelvis and left-sided renal calyces, no contrast within the left ureter. No ureteral or bladder   calculus evident.     Vasculature: Mild atherosclerotic calcification. No aneurysmal dilatation     Small Bowel: The small bowel is not distended.     Colon: No colonic wall thickening is appreciated. Small stool throughout the colon, greater on the right. Colonic diverticula of the sigmoid and distal descending colon without pericolonic induration.     Lymph Nodes: No adenopathy by imaging size criteria.     Bladder: Normal.     Prostate: Normal.     Soft Tissues: Periumbilical hernia containing fat with os of 2.4 cm and  measuring up to 6.2 cm transversely     Bones: Degenerative changes of the thoracal lumbar spine with partial ankylosis of the bilateral sacroiliac joints     Miscellaneous: No free fluid or ascites.    Impression   1. No significant change in moderate left hydronephrosis and hydroureter with urine-contrast level in the left dilated renal pelvis. No renal, ureteral or bladder calculi evident. Findings may be related to underlying stricture/lesion or nonradiopaque   calculus. Correlation with direct visualization might be considered.   2. No small bowel obstruction. Diverticulosis without diverticulitis. Periumbilical hernia.        11/28/2020-MR abd WWO contrast:   ABDOMEN FINDINGS:   Liver: Liver is normal in size. There is 1.3 mm cystic area seen in the segment   5/6 of liver. Hiatal hernia contrast enhancement images shows motion artifact   however venous phase shows peripheral nodular enhancement and gradual filling   possible hemangioma     Spleen: Homogenous. No focal lesions identified.     Adrenal Glands: Normal.      Gallbladder and Bile Ducts: Normal.     Pancreas: Normal.     Right Kidney: Normal.     Left Kidney: Mild hydronephrosis     Lymph Nodes: No adenopathy by imaging size criteria.     Bones: Normal.     Miscellaneous: Over distended urinary bladder.     Impression   9 mm cyst segment 6 of liver.   Over distended urinary bladder with mild left hydronephrosis.   Findings were discussed with [place], PA, on 12/05/2020 9:55 AM.      11/19/2020-CT abd/pelvis W contrast:   FINDINGS:  Normal lung bases, cardiac margins and pericardium    CT abdomen/pelvis:  Normal dimension liver, 13 mm region of diminished density, likely hemangioma, not clearly defined on earlier studies most likely due to the timing of contrast.  Consider MRI corroboration.  Normal gallbladder and biliary tree.  Normal spleen and pancreas.  Normal adrenal glands.  Normal right kidney.  Left hydronephrosis and hydroureter to the left UVJ.  No obstructing uroliths identified.  No ascites or suspicious adenopathy.  Normal dimension abdominal aorta.  Minimal atherosclerosis.  Patent branch vessels.  No bowel obstruction or acute inflammation.  Normal appendix.  Fat containing ventral hernia 27 mm opening.  There is massive distention of the urinary bladder.  No pelvic mass, adenopathy or free pelvic fluid.  No acute bony findings.    IMPRESSION:  Massive urinary bladder distention with left hydronephrosis and hydroureter without obstructing stone  Indeterminate mass segment 6 of the liver consider MRI operation peer    10/29/2020-Abd [place]:   FINDINGS:    The study is technically limited, in part due to patient body habitus.       The liver is normal in contour and there is no demonstrable mass or intrahepatic biliary ductal   dilation. The common bile duct was not well visualized.       The gallbladder is nondistended and there is no mural thickening, pericholecystic fluid, or   cholelithiasis.       The pancreas is obscured by bowel  gas. The spleen measures 11.3 cm and is normal in morphology.       The right kidney is normal in contour and echotexture and there is no mass, hydronephrosis, or   stone. The left kidney is not well assessed due to overlying ribs. The technologist suspected left   hydronephrosis on real-time imaging, but this  is not well depicted on the presented images.       Visualized aspects of the aorta are normal in caliber.       IMPRESSION:    Limited; negative. Consider follow-up contrast-enhanced CT to better assess the abdominal viscera.         IMPRESSION: 65 y.o. male with left hydronephrosis.  Prostate enlargement.  Urethral bulbar stricture. Urinary retention.     PLAN:  Prior uroflow testing consistent with obstruction questionable straining pattern concerning for detrusor muscle dysfunction.    Continues with elevated PVR of 760mL  Again, reviewed prior CT urogram consistent with moderate to severe left hydronephrosis  Updated renal US continues to show moderate left hydronephrosis    Patient remains asymptomatic overall  Prior labs showed normal renal function.    Further evaluation through cystoscopy followed by retrograde urethrogram confirmed stricture in the bulbar region  Discussed options regarding treatment including dilation, OIU and urethroplasty  Risks vs. Benefits of procedures reviewed with patient  We will discuss best option with Dr. Izetta Dakin and proceed accordingly.     Again encourage double voiding  Updated PSA testing is stable    Thank you for allowing to participate in your patient care. I will keep you informed regarding his future follow-up.      Sincerely,    Vena Rua, [place]    5/17/202212:37 PM

## 2021-02-21 ENCOUNTER — Telehealth: Payer: Self-pay | Admitting: Urology

## 2021-02-21 DIAGNOSIS — Z01818 Encounter for other preprocedural examination: Secondary | ICD-10-CM

## 2021-02-21 NOTE — Telephone Encounter (Signed)
Called and spoke with patient and scheduled his procedure for 03/14/2021. We went over the following information:    Date of Surgery: Friday June 10th  Location of Surgery: [address] [place], [address]  COVID Date: Monday June 6th at [facility] (time will be given by [facility])    1.) No Aspirin, Aleve, or Ibuprofen 5 days prior to surgery- Tylenol is okay to take  2.) No food or drink after midnight the night prior- Last snack can be 11:00 PM on Thursday June 9th  3.) You will need to have a driver with you as you are going under General Anesthesia.  4.) You will need to have a COVID test done 5 days prior to surgery- Monday June 6th at [facility]  5.) You will need to call the day before, between 1:00 PM- 3:00 PM, for the arrival time of your surgery- The number for you to call is: [phone]

## 2021-02-21 NOTE — Telephone Encounter (Signed)
-----   Message from Vena Rua, [place] sent at 02/21/2021  3:05 PM EDT -----  Regarding: surgery  Please schedule patient for OIU and cystoscopy   Prefers  hospital   Thanks    Vena Rua, [place]

## 2021-02-23 ENCOUNTER — Encounter: Payer: Self-pay | Admitting: Primary Care

## 2021-02-24 NOTE — Telephone Encounter (Signed)
Orders in 

## 2021-02-24 NOTE — Addendum Note (Signed)
Addended byGenene Churn on: 02/24/2021 07:47 AM     Modules accepted: Orders

## 2021-02-25 ENCOUNTER — Encounter: Payer: Self-pay | Admitting: Urology

## 2021-02-25 NOTE — Telephone Encounter (Signed)
1 week 

## 2021-03-05 ENCOUNTER — Other Ambulatory Visit: Payer: Self-pay | Admitting: Primary Care

## 2021-03-05 MED ORDER — CLONAZEPAM 1 MG PO TABS *I*
1.0000 mg | ORAL_TABLET | Freq: Two times a day (BID) | ORAL | 0 refills | Status: DC
Start: 2021-03-05 — End: 2021-07-10

## 2021-03-05 NOTE — Telephone Encounter (Signed)
Discussed patient concerns  Will proceed with surgery as discussed  Vena Rua, [place]

## 2021-03-05 NOTE — Telephone Encounter (Signed)
Clonazepam last dispensed 01/01/21. Pending.  PMP checked  Ref # [id number]

## 2021-03-07 ENCOUNTER — Telehealth: Payer: Self-pay | Admitting: Primary Care

## 2021-03-07 ENCOUNTER — Encounter: Payer: Self-pay | Admitting: Urology

## 2021-03-07 ENCOUNTER — Encounter: Payer: Self-pay | Admitting: Primary Care

## 2021-03-07 NOTE — Telephone Encounter (Signed)
Please advise 

## 2021-03-07 NOTE — Telephone Encounter (Signed)
Craig Gilbert aware.

## 2021-03-07 NOTE — Telephone Encounter (Signed)
Hold 2 days beforehand and start the day after procedure. With his clotting history I would like him to bridge with Lovenox though.

## 2021-03-07 NOTE — Telephone Encounter (Signed)
Carrie from Dr. Izetta Dakin office called and states patient is scheduled for a procedure next week. Doctor needs to know how long to hold his Eliquis?    Morey Hummingbird can be reached at [phone]

## 2021-03-08 ENCOUNTER — Other Ambulatory Visit: Payer: Self-pay | Admitting: Primary Care

## 2021-03-08 ENCOUNTER — Other Ambulatory Visit: Payer: Self-pay | Admitting: Urology

## 2021-03-10 ENCOUNTER — Ambulatory Visit: Payer: Medicaid Other | Attending: Urology

## 2021-03-10 DIAGNOSIS — Z01812 Encounter for preprocedural laboratory examination: Secondary | ICD-10-CM | POA: Insufficient documentation

## 2021-03-10 DIAGNOSIS — Z01818 Encounter for other preprocedural examination: Secondary | ICD-10-CM | POA: Insufficient documentation

## 2021-03-10 DIAGNOSIS — Z20822 Contact with and (suspected) exposure to covid-19: Secondary | ICD-10-CM | POA: Insufficient documentation

## 2021-03-10 DIAGNOSIS — Z20828 Contact with and (suspected) exposure to other viral communicable diseases: Secondary | ICD-10-CM | POA: Insufficient documentation

## 2021-03-11 LAB — COVID-19 NAAT (PCR): COVID-19 NAAT (PCR): NEGATIVE

## 2021-03-11 LAB — COVID-19 PCR

## 2021-03-13 ENCOUNTER — Telehealth: Payer: Self-pay | Admitting: Primary Care

## 2021-03-13 NOTE — Invasive Procedure Plan of Care (Signed)
[place]  OR SURGICAL PROCEDURE                            Patient Name: Craig Gilbert  [facility] [address]                                                              DOB: 1955-10-15         Please read this form or have someone read it to you.   It's important to understand all parts of this form. If something isn't clear, ask [place] to explain.   When you sign it, that means you understand the form and give [place] permission to do this surgery or procedure.     I agree for Lowella Dandy, MD along with any assistants* they may choose, to treat the following condition(s): Urethral Stricture   By doing this surgery or procedure on me: Passing a scope in to the urethra, internal incision of the stricture , possible Foley catheter placement    This is also known as: Optical internal Urethrotomy, possible Foley catheter placement    Laterality: Not applicable     *if you'd like a list of the assistants, please ask. We can give that to you.    1. The care provider has explained my condition to me. They have told me how the procedure can help me. They have told me about other ways of treating my condition. I understand the care provider cannot guarantee the result of the procedure. If I don't have this procedure, my other choices are: Observation, suprapubic catheter, urethral dilation, excision urethroplasty    2. The care provider has told me the risks (problems that can happen) of the procedure. I understand there may be unwanted results. The risks that are related to this procedure include: Injury to Urethra, Bladder and surrounding structure, Bleeding, infection, failure of procedure, need of suprapubic catheter and anaesthesia complications    3. I understand that during the procedure, my care provider may find a condition that we didn't know about before the treatment started. Therefore, I agree that my care provider can perform any other treatment which they think is necessary and available.    4. I give  permission to the hospital and/or its departments to examine and keep tissue, blood, body parts, fluids or materials removed from my body during the procedure(s) to aid in diagnosis and treatment, after which they may be used for scientific research or teaching by appropriate persons. If these materials are used for science or teaching, my identity will be protected. I will no longer own or have any rights to these materials regardless of how they may be used.    5. My care provider might want a representative from a [employer] to be there during my procedure. I understand that person works for:          The ways they might help my care provider during my procedure include:            6. Here are my decisions about receiving blood, blood products, or tissues. I understand my decisions cover the time before, during and after my procedure, my treatment, and my time in the hospital. After my  procedure, if my condition changes a lot, my care provider will talk with me again about receiving blood or blood products. At that time, my care provider might need me to review and sign another consent form, about getting or refusing blood.    I understand that the blood is from the community blood supply. Volunteers donated the blood, the volunteers were screened for health problems. The blood was examined with very sensitive and accurate tests to look for hepatitis, HIV/AIDS, and other diseases. Before I receive blood, it is tested again to make sure it is the correct type.    My chances of getting a sickness from blood products are small. But no transfusion is 100% safe. I understand that my care provider feels the good I will receive from the blood is greater than the chances of something going wrong. My care provider has answered my questions about blood products.      My decision  about blood or  blood products           My decision   about tissue  Implants              I understand this  form.    My care  provider  or his/her  assistants have  explained:   What I am having done and why I need it.  What other choices I can make instead of having this done.  The benefits and possible risks (problems) to me of having this done.  The benefits and possible risks (problems) to me of receiving transplants, blood, or blood products.  There is no guarantee of the results.  The care provider may not stay with me the entire time that I am in the operating or procedure room.  My provider has explained how this may affect my procedure. My provider has answered my  questions about this.         I give my  permission for  this surgery or  procedure.            _______________________________________________                                     My signature  (or parent or other person authorized to sign for you, if you are unable to sign for  yourself or if you are under 66 years old)        ______           Date        _____        Time   Electronic Signatures will display at the bottom of the consent form.    Care provider's statement: I have discussed the planned procedure, including the possibility for transfusion of blood  products or receipt of tissue as necessary; expected benefits; the possible complications and risks; and possible alternatives  and their benefits and risks with the patients or the patient's surrogate. In my opinion, the patient or the patient's surrogate  understands the proposed procedure, its risks, benefits and alternatives.              Electronically signed by: Lowella Dandy, MD  03/13/2021         Date        10:10 PM        Time

## 2021-03-13 NOTE — Telephone Encounter (Signed)
Patient called the nurse voicemail stating he has surgery tomorrow and he was supposed to be sent something that "started with an 'L'". I assume he means lovenox. I read the note and you wanted him to bridge.

## 2021-03-13 NOTE — Telephone Encounter (Signed)
Good Morning;    Per last discussion, patient needs to be bridged with Lovenox- he sent a MyChart message stating that it has not been filled. He has surgery tomorrow and should be off his Eliquis.    Please advise if the Lovenox has been sent.    Thanks!

## 2021-03-14 ENCOUNTER — Ambulatory Visit: Payer: Medicaid Other | Admitting: Certified Registered"

## 2021-03-14 ENCOUNTER — Encounter
Admission: RE | Disposition: A | Discharge status: Home / Self Care | Payer: Self-pay | Source: Ambulatory Visit | Attending: Urology

## 2021-03-14 ENCOUNTER — Encounter: Payer: Medicaid Other | Admitting: Urology

## 2021-03-14 ENCOUNTER — Ambulatory Visit
Admission: RE | Admit: 2021-03-14 | Discharge: 2021-03-14 | Disposition: A | Discharge status: Home / Self Care | Payer: Medicaid Other | Source: Ambulatory Visit | Attending: Urology | Admitting: Urology

## 2021-03-14 ENCOUNTER — Encounter: Payer: Self-pay | Admitting: Urology

## 2021-03-14 DIAGNOSIS — N3289 Other specified disorders of bladder: Secondary | ICD-10-CM | POA: Insufficient documentation

## 2021-03-14 DIAGNOSIS — E669 Obesity, unspecified: Secondary | ICD-10-CM | POA: Insufficient documentation

## 2021-03-14 DIAGNOSIS — F17210 Nicotine dependence, cigarettes, uncomplicated: Secondary | ICD-10-CM | POA: Insufficient documentation

## 2021-03-14 DIAGNOSIS — N35812 Other urethral bulbous stricture, male: Secondary | ICD-10-CM | POA: Insufficient documentation

## 2021-03-14 DIAGNOSIS — F329 Major depressive disorder, single episode, unspecified: Secondary | ICD-10-CM | POA: Insufficient documentation

## 2021-03-14 DIAGNOSIS — E119 Type 2 diabetes mellitus without complications: Secondary | ICD-10-CM | POA: Insufficient documentation

## 2021-03-14 DIAGNOSIS — I10 Essential (primary) hypertension: Secondary | ICD-10-CM | POA: Insufficient documentation

## 2021-03-14 DIAGNOSIS — N35912 Unspecified bulbous urethral stricture, male: Secondary | ICD-10-CM

## 2021-03-14 DIAGNOSIS — N1330 Unspecified hydronephrosis: Secondary | ICD-10-CM

## 2021-03-14 DIAGNOSIS — Z6834 Body mass index (BMI) 34.0-34.9, adult: Secondary | ICD-10-CM | POA: Insufficient documentation

## 2021-03-14 DIAGNOSIS — N400 Enlarged prostate without lower urinary tract symptoms: Secondary | ICD-10-CM | POA: Insufficient documentation

## 2021-03-14 HISTORY — PX: PR CYSTOURETHROSCOPY: 52000

## 2021-03-14 HISTORY — DX: Unspecified hydronephrosis: N13.30

## 2021-03-14 HISTORY — DX: Neuralgia and neuritis, unspecified: M79.2

## 2021-03-14 HISTORY — DX: Pure hypercholesterolemia, unspecified: E78.00

## 2021-03-14 HISTORY — DX: Unspecified osteoarthritis, unspecified site: M19.90

## 2021-03-14 HISTORY — DX: Retention of urine, unspecified: R33.9

## 2021-03-14 HISTORY — DX: Sciatica, unspecified side: M54.30

## 2021-03-14 HISTORY — DX: Unspecified urethral stricture, male, unspecified site: N35.919

## 2021-03-14 HISTORY — DX: Other pulmonary embolism without acute cor pulmonale: I26.99

## 2021-03-14 HISTORY — DX: Anxiety disorder, unspecified: F41.9

## 2021-03-14 LAB — POCT GLUCOSE: Glucose POCT: 298 mg/dL — ABNORMAL HIGH (ref 60–99)

## 2021-03-14 SURGERY — CYSTOSCOPY
Anesthesia: Monitor Anesthesia Care | Wound class: Clean Contaminated

## 2021-03-14 MED ORDER — BELLADONNA-OPIUM 16.2-30 MG RE SUPP *I*
RECTAL | Status: DC | PRN
Start: 2021-03-14 — End: 2021-03-14
  Administered 2021-03-14: 30 mg via RECTAL

## 2021-03-14 MED ORDER — LACTATED RINGERS IV SOLN *I*
50.0000 mL/h | INTRAVENOUS | Status: DC
Start: 2021-03-14 — End: 2021-03-14

## 2021-03-14 MED ORDER — PROPOFOL 10 MG/ML IV EMUL (INTERMITTENT DOSING) WRAPPED *I*
INTRAVENOUS | Status: DC | PRN
Start: 2021-03-14 — End: 2021-03-14
  Administered 2021-03-14: 50 mg via INTRAVENOUS
  Administered 2021-03-14: 100 mg via INTRAVENOUS
  Administered 2021-03-14: 50 mg via INTRAVENOUS

## 2021-03-14 MED ORDER — MIDAZOLAM HCL 1 MG/ML IJ SOLN *I* WRAPPED
INTRAMUSCULAR | Status: AC
Start: 2021-03-14 — End: 2021-03-14
  Filled 2021-03-14: qty 2

## 2021-03-14 MED ORDER — LIDOCAINE HCL 2 % EX JELLY SYRINGE *I*
CUTANEOUS | Status: AC
Start: 2021-03-14 — End: 2021-03-14
  Filled 2021-03-14: qty 11

## 2021-03-14 MED ORDER — LIDOCAINE HCL (PF) 1 % IJ SOLN *I*
0.1000 mL | INTRAMUSCULAR | Status: DC | PRN
Start: 2021-03-14 — End: 2021-03-14

## 2021-03-14 MED ORDER — FENTANYL CITRATE 50 MCG/ML IJ SOLN *WRAPPED*
INTRAMUSCULAR | Status: AC
Start: 2021-03-14 — End: 2021-03-14
  Filled 2021-03-14: qty 1

## 2021-03-14 MED ORDER — TAMSULOSIN HCL 0.4 MG PO CAPS *I*
0.4000 mg | ORAL_CAPSULE | Freq: Every evening | ORAL | 0 refills | Status: DC
Start: 2021-03-14 — End: 2021-08-08

## 2021-03-14 MED ORDER — FENTANYL CITRATE 50 MCG/ML IJ SOLN *WRAPPED*
INTRAMUSCULAR | Status: DC | PRN
Start: 2021-03-14 — End: 2021-03-14
  Administered 2021-03-14: 50 ug via INTRAVENOUS

## 2021-03-14 MED ORDER — ONDANSETRON HCL 2 MG/ML IV SOLN *I*
INTRAMUSCULAR | Status: DC | PRN
Start: 2021-03-14 — End: 2021-03-14
  Administered 2021-03-14: 4 mg via INTRAMUSCULAR

## 2021-03-14 MED ORDER — DEXAMETHASONE SODIUM PHOSPHATE 10 MG/ML IJ SOLN *I*
INTRAMUSCULAR | Status: AC
Start: 2021-03-14 — End: 2021-03-14
  Filled 2021-03-14: qty 1

## 2021-03-14 MED ORDER — CEFAZOLIN SODIUM 2 G IJ/IV SOLR *WRAPPED*
INTRAMUSCULAR | Status: AC
Start: 2021-03-14 — End: 2021-03-14
  Filled 2021-03-14: qty 20

## 2021-03-14 MED ORDER — MIDAZOLAM HCL 1 MG/ML IJ SOLN *I* WRAPPED
INTRAMUSCULAR | Status: DC | PRN
Start: 2021-03-14 — End: 2021-03-14
  Administered 2021-03-14: 2 mg via INTRAVENOUS

## 2021-03-14 MED ORDER — ACETAMINOPHEN 500 MG PO TABS *I*
1000.0000 mg | ORAL_TABLET | Freq: Once | ORAL | Status: DC
Start: 2021-03-14 — End: 2021-03-14

## 2021-03-14 MED ORDER — DEXAMETHASONE SODIUM PHOSPHATE 4 MG/ML INJ SOLN *WRAPPED*
INTRAMUSCULAR | Status: DC | PRN
Start: 2021-03-14 — End: 2021-03-14
  Administered 2021-03-14: 10 mg via INTRAVENOUS

## 2021-03-14 MED ORDER — CEFAZOLIN SODIUM 1000 MG IJ SOLR *I*
INTRAMUSCULAR | Status: AC
Start: 2021-03-14 — End: 2021-03-14
  Filled 2021-03-14: qty 10

## 2021-03-14 MED ORDER — CEFAZOLIN SODIUM 1000 MG IJ SOLR *I*
INTRAMUSCULAR | Status: DC | PRN
Start: 2021-03-14 — End: 2021-03-14
  Administered 2021-03-14: 3000 mg via INTRAVENOUS

## 2021-03-14 MED ORDER — LACTATED RINGERS IV SOLN *I*
50.0000 mL/h | INTRAVENOUS | Status: DC
Start: 2021-03-14 — End: 2021-03-14
  Administered 2021-03-14: 50 mL/h via INTRAVENOUS

## 2021-03-14 MED ORDER — FENTANYL CITRATE 50 MCG/ML IJ SOLN *WRAPPED*
25.0000 ug | INTRAMUSCULAR | Status: DC | PRN
Start: 2021-03-14 — End: 2021-03-14

## 2021-03-14 MED ORDER — PROPOFOL 10 MG/ML IV EMUL (INTERMITTENT DOSING) WRAPPED *I*
INTRAVENOUS | Status: AC
Start: 2021-03-14 — End: 2021-03-14
  Filled 2021-03-14: qty 20

## 2021-03-14 MED ORDER — LIDOCAINE HCL 2 % IJ SOLN *I*
INTRAMUSCULAR | Status: DC | PRN
Start: 2021-03-14 — End: 2021-03-14
  Administered 2021-03-14: 20 mg via INTRAVENOUS

## 2021-03-14 MED ORDER — LIDOCAINE HCL 2 % (PF) IJ SOLN *I*
INTRAMUSCULAR | Status: AC
Start: 2021-03-14 — End: 2021-03-14
  Filled 2021-03-14: qty 5

## 2021-03-14 MED ORDER — LIDOCAINE HCL 2 % UROJET *I*
CUTANEOUS | Status: DC | PRN
Start: 2021-03-14 — End: 2021-03-14
  Administered 2021-03-14: 11 mL via URETHRAL

## 2021-03-14 MED ORDER — BELLADONNA-OPIUM 16.2-30 MG RE SUPP *I*
RECTAL | Status: AC
Start: 2021-03-14 — End: 2021-03-14
  Filled 2021-03-14: qty 1

## 2021-03-14 MED ORDER — ONDANSETRON HCL 2 MG/ML IV SOLN *I*
INTRAMUSCULAR | Status: AC
Start: 2021-03-14 — End: 2021-03-14
  Filled 2021-03-14: qty 2

## 2021-03-14 SURGICAL SUPPLY — 22 items
BEDSIDE CLEANING KIT (Supply) ×2 IMPLANT
CATH FOLEY DOVER SIL 20FR 10ML (Catheter) ×2 IMPLANT
DRAPE SURG FAN-FOLD 44 X 58IN (Drape) ×2 IMPLANT
GLOVE EXAM NITRILE MEDIUM (Glove) ×16 IMPLANT
GLOVE SURG PROTEXIS PI 7.0 PF SYN (Glove) ×4 IMPLANT
HIBICLENS LIQUID FLIP-TOP 4OZ (Supply) ×2 IMPLANT
LUBRICANT JELLY 4OZ STER (Supply) ×1
LUBRICANT JELLY 4OZ STERILE (Supply) ×1 IMPLANT
LUBRICATING JELLY - 3 GRAM (Supply) IMPLANT
PAD GROUNDING ADULT SURE FIT (Supply) IMPLANT
SOL SOD CHL IRRIG .9PCT 3000ML BAG (Solution) ×2 IMPLANT
SOL SOD CHL IV .9PCT 1000ML BAG (Drug) ×2 IMPLANT
SPECIMEN CONTAINER UNSTERILE (Supply) ×2 IMPLANT
SPONGE GZE 4X4 STER 10IN S 12 (Dressing) ×2 IMPLANT
STOPCOCK 3 WAY MALE (Supply) ×1
STOPCOCK IV LG BOR 3 GANG 3 W STPCOCK W/ EXT M LL LIP RESIST HI-FLO (Supply) ×1 IMPLANT
SYRINGE 10CC LUERLOCK LF (Syringe)
SYRINGE LUERLOCK 10ML INDIVIDUAL WRAP (Syringe) IMPLANT
SYRINGE LUERLOCK 30ML INDIVIDUAL WRAP (Syringe) ×1 IMPLANT
SYRINGE ONLY 30ML LUER-LOK (Syringe) ×1
TOWEL SURG 17 X 27IN BLUE STER DISP (Towel) ×2 IMPLANT
TUBE IRRIGATION CYSTO GRAV 80IN LG LF (Supply) ×2 IMPLANT

## 2021-03-14 NOTE — Op Note (Addendum)
Operative Note (Surgical Case/Log ID: [id number])       Date of Surgery: 03/14/2021       Surgeons: Juliann Mule) and Role:     Lowella Dandy, MD - Primary       Pre-op Diagnosis: Urethral stricture         Post-op Diagnosis: Urethral stricture       Procedure(s) Performed: Procedure(s) (LRB):  CYSTOSCOPY with  internal uretheromy (N/A)       Anesthesia Type: Monitor Anesthesia Care        Fluid Totals: No intake/output data recorded.       Estimated Blood Loss: 0 mL       Specimens to Pathology:  ID Type Source Tests Collected by Time Destination   1 : urine culture URINE Urine (kidney, neph, ureter, stent, cystoscopy, prostatic massage, suprapubic tap) GRAM STAIN, AEROBIC CULTURE Rollen Sox, RN 03/14/2021 1012           Temporary Implants:        Packing:                 Patient Condition: good       Indications: Craig Gilbert is a 65 y.o. male with urethral stricture , He was counseled on treatment options and elected to proceed with cystoscopy and optical internal urethrotomy.   Informed consent was signed.       Findings (Including unexpected complications): Urethral stricture about 2 cm in bulbar urethra  Mildly enlarged prostate, open bladder neck  Capacious bladder   Both ureteric orifices in normal orthotopic position  No exophytic papillary or sessile lesion in bladder       Description of Procedure:  The patient was brought into the operating room and underwent general anesthesia without complication.  He was placed in dorsal lithotomy position taking care to pad all bony prominences and avoid compression to lower extremity vascular structures.   and genitals were prepped and draped in usual sterile fashion.    He was given preoperative prophylactic antibiotics = ancef 2 gm.    A 45f cystoscope was inserted into the urethra above findings noted. A 23 Fr optical urethrotome was passed in to the urethra, urethral stricture was incised at 12 O clock to healthy looking tissues. urethrotome was advanced  into the bladder, thorough cystoscopy was performed above findings were noted.  A 20 Fr Foley catheter was placed in bladder, retained with 10 cc sterile water     This completed the procedure, which the patient tolerated well. Anesthesia was reversed and the patient was taken to the recovery room without incidence.    Signed:  Lowella Dandy, MD   on 03/14/2021 at 1:33 PM    Signed:  Lowella Dandy, MD  on 03/14/2021 at 1:13 PM

## 2021-03-14 NOTE — Anesthesia Case Conclusion (Signed)
CASE CONCLUSION  [facility]  Transport  Patient Condition on Handoff  Level of Consciousness:  Mildly sedated  Patient Condition:  Stable  Handoff Report to:  RN

## 2021-03-14 NOTE — H&P (Signed)
UPDATES TO PATIENT'S CONDITION on the DAY OF SURGERY/PROCEDURE    I. Updates to Patient's Condition (to be completed by a provider privileged to complete a H&P, following reassessment of the patient by the provider):    Day of Surgery Update:  History  No change    Physical  Physical exam updated and no change         II. Procedure Readiness   I have reviewed the patient's H&P and updated condition. By completing and signing this form, I attest that this patient is ready for surgery.      III. Attestation   I have reviewed the updated information regarding the patient's condition and it is appropriate to proceed with the planned surgery/procedure.    Lowella Dandy, MD as of 9:29 AM 03/14/2021

## 2021-03-14 NOTE — Discharge Instructions (Signed)
Urology Discharge Instructions  Dr. Izetta Dakin  [phone]  Discharge/Post-operative instructions: General Urology      You have received sedative mediation and/or general anesthesia which may make you drowsy for as long as 24 hours  o DO NOT drive or operate any machinery for 24 hours  o DO NOT drink alcoholic beverages for 24 hours  o DO NOT make major decisions, sign contracts etc. for 24 hours   Diet: you may resume your usual diet   Activity: no strenuous exercise. No lifting over 10 lbs. it is OK to gradually increase your walking as tolerated and climb stairs.   Bathing/Showering: you may shower tomorrow. No tub bathing. Do not rub your staples or sutures. Pat all of your incisions dry. Do NOT apply lotions or creams to your incisions   Foley catheter: If you go home with a foley catheter, perform routine foley catheter care as instructed prior to your discharge from the surgery center. You will need to return to our office on  03/21/2021 to have it removed.  o you may have some blood in your catheter - call Dr. Izetta Dakin if gross/large amount of blood, or if the catheter stops draining  o push fluids to keep urine clear   o Your catheter has 10  ml in the balloon holding it in place   Aspirin/Anticoagulants: you may resume your aspirin or anticoagulant medication 24 hours after your urine remains clear - if your urine is still bloody 72 hours after your procedure please call our office to discuss resuming your aspirin or other anticoagulant medication   Cough: You need to cough and deep breathe, at least 10 times every 2 hours while you are awake, until you are back to your usual activity level. This will help keep your lungs expanded and clear any secretions that are there. If you have an incentive spirometer or flutter valve from the hospital, remember to use them every 2 hours as well, with your lung exercises. It is also important to ambulate and return to non strenuous activities as soon as you  can.   Pain management recommendation: assess your level of pain on a scale from 0 to 10. You should take narcotic pain medication if your pain level is greater than 3. Be sure to eat prior to taking the medication. If you were prescribed non-narcotic pain medication please take as directed.   Medication information: you will be prescribed some or all of the following:  o Flomax 0.4 mg at bedtime  o Antibiotics: take your antibiotics as prescribed - be sure to finish the entire course   If you notice any of the following problems, call Dr. Izetta Dakin right away: [phone] or contact the Emergency Department at [phone].  o Fever greater than 101F  o Pain not relieved by the medications ordered  o Swelling or Redness  o Heavy bleeding  o Foul drainage  o Inability to urinate

## 2021-03-14 NOTE — Anesthesia Procedure Notes (Signed)
---------------------------------------------------------------------------------------------------------------------------------------    AIRWAY   GENERAL INFORMATION AND STAFF    Patient location during procedure: OR       Date of Procedure: 03/14/2021 9:53 AM  CONDITION PRIOR TO MANIPULATION     Current Airway/Neck Condition:  Normal        For more airway physical exam details, see Anesthesia PreOp Evaluation  AIRWAY METHOD     Patient Position:  Sniffing    Maintained In-Line Stability: not needed, normal c-spine condition          To see details of medications used, see MAR    Induction: Mask  FINAL AIRWAY DETAILS  Final Airway Type:  Mask  ----------------------------------------------------------------------------------------------------------------------------------------

## 2021-03-14 NOTE — Anesthesia Preprocedure Evaluation (Signed)
Anesthesia Pre-operative History and Physical for Craig Gilbert    Highlighted Issues for this Procedure:  65 y.o. male with Hydronephrosis, unspecified hydronephrosis type (N13.30) presenting for Procedure(s) (LRB):  CYSTOSCOPY with  internal uretheromy (N/A) by Surgeon(s):  Lowella Dandy, MD scheduled for 80 minutes.        .  .  Anesthesia Evaluation Information Source: patient, records     ANESTHESIA HISTORY     Denies anesthesia history    GENERAL    + Obesity          central     PULMONARY    + Smoker          tobacco currently  Comment: H/o PE    CARDIOVASCULAR    + Hypertension          well controlled    Comment: MI    GI/HEPATIC/RENAL   NPO: > 8hrs ago (solids)      + Renal Issues  Comment: Urethral stricture     NEURO/PSYCH    + Neuropsychiatric Issues          depression    ENDO/OTHER    + Diabetes Mellitus         no insulin             Physical Exam    Airway            Mouth opening: normal            Mallampati: II            TM distance (fb): >3 FB            Neck ROM: full            Facial hair: beard, mustache  Dental    Comment: Loose rt lower molar   Cardiovascular           Rhythm: regular           Mental Status     oriented to person, place and time         ________________________________________________________________________  PLAN  ASA Score  3  Anesthetic Plan MAC       General Anesthesia/Sedation Maintenance Plan (IV bolus); Airway (mask); Line ( use current access); Monitoring (standard ASA); Positioning (lithotomy); PONV Plan (ondansetron and dexamethasone); Pain (per surgical team); PostOp (ASC)    Informed Consent     Risks:        Risks discussed were commensurate with the plan listed above with the following specific points: N/V and aspiration, Damage to: allergic Rx.    Anesthetic Consent:         Anesthetic plan (and risks as noted above) were discussed with patient    Plan also discussed with team members including:       surgeon and attending    Responsible Anesthesia  Provider Attestation:  I attest that the patient or proxy understands and accepts the risks and benefits of the anesthesia plan. I also attest that I have personally performed a pre-anesthetic examination and evaluation, and prescribed the anesthetic plan for this particular location within 48 hours prior to the anesthetic as documented. Kathrynn Running, MD  03/14/21, 9:38 AM

## 2021-03-14 NOTE — Anesthesia Postprocedure Evaluation (Signed)
Anesthesia Post-Op Note    Patient: Craig Gilbert    Procedure(s) Performed:  Procedure Summary  Date:  03/14/2021 Anesthesia Start: 03/14/2021  9:53 AM Anesthesia Stop: 03/14/2021 10:39 AM Room / Location:  [id number] MAIN OR   Procedure(s):  CYSTOSCOPY with  internal uretheromy Diagnosis:  Hydronephrosis, unspecified hydronephrosis type [N13.30] Surgeon(s):  Lowella Dandy, MD Responsible Anesthesia Provider:  Kathrynn Running, MD         Recovery Vitals  BP: 138/69 (03/14/2021  8:35 AM)  Heart Rate: 66 (03/14/2021  8:35 AM)  Resp: 11 (03/14/2021  8:35 AM)  Temp: 35.9 C (96.6 F) (03/14/2021  8:35 AM)  SpO2: 96 % (03/14/2021  8:35 AM)   0-10 Scale: 8 (03/14/2021  8:35 AM)    Anesthesia type:  MAC  Complications Noted During Procedure or in PACU:  None   Comment:    Patient Location:  ASC  Level of Consciousness:    Recovered to baseline  Patient Participation:     Able to participate  Temperature Status:    Normothermic  Oxygen Saturation:    Within patient's normal range  Cardiac Status:   Within patient's normal range  Fluid Status:    Stable  Pulmonary Status:    Baseline  Pain Management:    Adequate analgesia  Nausea and Vomiting:  None    Post Op Assessment:    Tolerated procedure well  Responsible Anesthesia Provider Attestation:  All indicated post anesthesia care provided       -

## 2021-03-15 LAB — AEROBIC CULTURE: Aerobic Culture: 0

## 2021-03-15 LAB — GRAM STAIN: Gram Stain: 0

## 2021-03-16 ENCOUNTER — Encounter: Payer: Self-pay | Admitting: Urology

## 2021-03-17 NOTE — Telephone Encounter (Signed)
Called and spoke with patient, answered all questions.   States catheter is uncomfortable, he will call if this does not get any better.   Thanks Lovena Le      Foley catheter: If you go home with a foley catheter, perform routine foley catheter care as instructed prior to your discharge from the surgery center. You will need to return to our office on  03/21/2021 to have it removed.  ? you may have some blood in your catheter - call Dr. Izetta Dakin if gross/large amount of blood, or if the catheter stops draining  ? push fluids to keep urine clear                          ? Your catheter has 10  ml in the balloon holding it in place

## 2021-03-18 ENCOUNTER — Encounter: Payer: Self-pay | Admitting: Urology

## 2021-03-19 ENCOUNTER — Emergency Department
Admission: EM | Admit: 2021-03-19 | Discharge: 2021-03-20 | Discharge status: Against Medical Advice | Payer: Medicaid Other | Source: Ambulatory Visit | Attending: Student in an Organized Health Care Education/Training Program | Admitting: Student in an Organized Health Care Education/Training Program

## 2021-03-19 ENCOUNTER — Emergency Department: Payer: Medicaid Other

## 2021-03-19 ENCOUNTER — Inpatient Hospital Stay: Admit: 2021-03-19 | Discharge: 2021-03-19 | Disposition: A | Discharge status: Home / Self Care | Payer: Self-pay

## 2021-03-19 ENCOUNTER — Telehealth: Payer: Self-pay | Admitting: Urology

## 2021-03-19 ENCOUNTER — Telehealth: Payer: Self-pay | Admitting: Student in an Organized Health Care Education/Training Program

## 2021-03-19 DIAGNOSIS — N390 Urinary tract infection, site not specified: Secondary | ICD-10-CM | POA: Insufficient documentation

## 2021-03-19 DIAGNOSIS — Y998 Other external cause status: Secondary | ICD-10-CM | POA: Insufficient documentation

## 2021-03-19 DIAGNOSIS — A419 Sepsis, unspecified organism: Secondary | ICD-10-CM

## 2021-03-19 DIAGNOSIS — Z20822 Contact with and (suspected) exposure to covid-19: Secondary | ICD-10-CM

## 2021-03-19 DIAGNOSIS — R110 Nausea: Secondary | ICD-10-CM

## 2021-03-19 DIAGNOSIS — Y9389 Activity, other specified: Secondary | ICD-10-CM | POA: Insufficient documentation

## 2021-03-19 DIAGNOSIS — Y9289 Other specified places as the place of occurrence of the external cause: Secondary | ICD-10-CM | POA: Insufficient documentation

## 2021-03-19 DIAGNOSIS — Y849 Medical procedure, unspecified as the cause of abnormal reaction of the patient, or of later complication, without mention of misadventure at the time of the procedure: Secondary | ICD-10-CM | POA: Insufficient documentation

## 2021-03-19 DIAGNOSIS — R300 Dysuria: Secondary | ICD-10-CM

## 2021-03-19 DIAGNOSIS — T83518A Infection and inflammatory reaction due to other urinary catheter, initial encounter: Secondary | ICD-10-CM | POA: Insufficient documentation

## 2021-03-19 DIAGNOSIS — R630 Anorexia: Secondary | ICD-10-CM

## 2021-03-19 DIAGNOSIS — T83511A Infection and inflammatory reaction due to indwelling urethral catheter, initial encounter: Secondary | ICD-10-CM

## 2021-03-19 DIAGNOSIS — R509 Fever, unspecified: Secondary | ICD-10-CM

## 2021-03-19 DIAGNOSIS — R001 Bradycardia, unspecified: Secondary | ICD-10-CM

## 2021-03-19 DIAGNOSIS — R5383 Other fatigue: Secondary | ICD-10-CM

## 2021-03-19 LAB — COMPREHENSIVE METABOLIC PANEL
ALT: 16 U/L (ref 0–50)
AST: 13 U/L (ref 0–50)
Albumin: 4.2 g/dL (ref 3.5–5.2)
Alk Phos: 145 U/L — ABNORMAL HIGH (ref 40–130)
Anion Gap: 16 (ref 7–16)
Bilirubin,Total: 0.7 mg/dL (ref 0.0–1.2)
CO2: 22 mmol/L (ref 20–28)
Calcium: 9.5 mg/dL (ref 8.6–10.2)
Chloride: 95 mmol/L — ABNORMAL LOW (ref 96–108)
Creatinine: 0.93 mg/dL (ref 0.67–1.17)
Glucose: 285 mg/dL — ABNORMAL HIGH (ref 60–99)
Lab: 12 mg/dL (ref 6–20)
Potassium: 3.7 mmol/L (ref 3.4–4.7)
Sodium: 133 mmol/L (ref 133–145)
Total Protein: 8.1 g/dL — ABNORMAL HIGH (ref 6.3–7.7)
eGFR BY CREAT: 92 *

## 2021-03-19 LAB — LACTATE, PLASMA: Lactate: 3.2 mmol/L — ABNORMAL HIGH (ref 0.5–2.2)

## 2021-03-19 LAB — CBC AND DIFFERENTIAL
Baso # K/uL: 0 10*3/uL (ref 0.0–0.1)
Basophil %: 0.1 %
Eos # K/uL: 0.1 10*3/uL (ref 0.0–0.5)
Eosinophil %: 0.7 %
Hematocrit: 47 % (ref 40–51)
Hemoglobin: 16.7 g/dL (ref 13.7–17.5)
IMM Granulocytes #: 0.1 10*3/uL — ABNORMAL HIGH (ref 0.0–0.0)
IMM Granulocytes: 0.4 %
Lymph # K/uL: 0.9 10*3/uL — ABNORMAL LOW (ref 1.3–3.6)
Lymphocyte %: 6.1 %
MCH: 31 pg (ref 26–32)
MCHC: 36 g/dL (ref 32–37)
MCV: 85 fL (ref 79–92)
Mono # K/uL: 0.7 10*3/uL (ref 0.3–0.8)
Monocyte %: 4.9 %
Neut # K/uL: 12.3 10*3/uL — ABNORMAL HIGH (ref 1.8–5.4)
Nucl RBC # K/uL: 0 10*3/uL (ref 0.0–0.0)
Nucl RBC %: 0 /100 WBC (ref 0.0–0.2)
Platelets: 199 10*3/uL (ref 150–330)
RBC: 5.5 MIL/uL (ref 4.6–6.1)
RDW: 12.1 % (ref 11.6–14.4)
Seg Neut %: 87.8 %
WBC: 14.1 10*3/uL — ABNORMAL HIGH (ref 4.2–9.1)

## 2021-03-19 LAB — URINALYSIS REFLEX TO CULTURE
Nitrite,UA: NEGATIVE
Specific Gravity,UA: 1.025 (ref 1.002–1.030)
pH,UA: 6.5 (ref 5.0–8.0)

## 2021-03-19 MED ORDER — CEFTRIAXONE SODIUM 1 GM IV/IJ SOLR *WRAPPED*
1000.0000 mg | Freq: Once | INTRAMUSCULAR | Status: AC
Start: 2021-03-20 — End: 2021-03-19
  Administered 2021-03-19: 1000 mg via INTRAVENOUS
  Filled 2021-03-19: qty 10

## 2021-03-19 MED ORDER — SODIUM CHLORIDE 0.9 % IV BOLUS *I*
1000.0000 mL | Freq: Once | Status: AC
Start: 2021-03-19 — End: 2021-03-19
  Administered 2021-03-19: 1000 mL via INTRAVENOUS

## 2021-03-19 MED ORDER — PHENAZOPYRIDINE HCL 100 MG PO TABS *I*
100.0000 mg | ORAL_TABLET | Freq: Three times a day (TID) | ORAL | 0 refills | Status: DC | PRN
Start: 2021-03-19 — End: 2021-07-08

## 2021-03-19 NOTE — Telephone Encounter (Signed)
Urology Brief Note    Patient called answering service with question regarding postop hematuria and GU pain.     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 His foley is draining very well, and has some small clots in it. He's also had penile burning pain starting last night, but no bladder spasms. He's drinking tons of water daily. He restarted eliquis the day before, but notes urine did not change in color after doing so, was stable light pink. He has not taken anything yet for pain. Denies LH, dizziness, fevers, chills.     We discussed that this sounds like normal postop hematuria. For his penile pain, I recommended tylenol/ibuprofen and will call in a small scrip of pyridium that he can pick up tomorrow. We also discussed possible complications and things to look out for which would necessitate a call back or seeking immediate care, including significantly worsening hematuria, nondraining foley, urinary retention, etc. He agreed to stop his eliquis and call us back if hematuria worsens.  All of his questions were answered at this time.    Clotile Whittington C. Geoffry Paradise, MD  Urology Resident, PGY-1   Pager: [phone]

## 2021-03-19 NOTE — Telephone Encounter (Signed)
Patient calling ins with severe pain 10/10 at times. Patient is having times of full bladder sensation and a feeling of being "kicked in the balls".   Patient states the cath is draining and working as it should.     Thanks

## 2021-03-19 NOTE — Telephone Encounter (Signed)
Patient has been complaining of catheter discomfort since surgery. Called on call last night and they recommended tylenol/ibuprofen and pyridium. Patient states it is not helping. He was also told by [place] previously some discomfort with catheter expected. He is having it out on Friday. Calling now complaining of 10 out of 10 testicular pain. He feels like his bladder is full off and on but his catheter is draining appropriately. Sounds like spasms. Unsure if appropriate for patient to have spasm medication since catheter is coming out so soon? Please advise. Thanks, [employer]

## 2021-03-19 NOTE — ED Provider Notes (Addendum)
History     Chief Complaint   Patient presents with    Dizziness    Fever    Shortness of Breath     Craig Gilbert is a 65 year old male with a history of type 2 diabetes mellitus, anxiety, hypertension, PE, sciatica who had a elective cystoscopy and optical internal urethrotomy on 03/14/2021 by Dr. Izetta Dakin, urologist at [facility] in [place].  Over the past few days patient has been complaining of generalized weakness and fatigue.  He complains of fever today and chills.  He reports pain and discomfort at the Foley catheter opening site and complains of bladder spasms.  He has had good urinary output.  He reports nausea.  He is alert and oriented x3 and coherent.  He is currently not on any antibiotics.               Medical/Surgical/Family History     Past Medical History:   Diagnosis Date    Anxiety     Arthritis     DM (diabetes mellitus)     High cholesterol     HTN (hypertension)     Hydronephrosis     Nerve pain     PE (pulmonary thromboembolism)     Restless leg syndrome     Sciatica     Urethral stricture     Urinary retention         There is no problem list on file for this patient.           Past Surgical History:   Procedure Laterality Date    PR CYSTOURETHROSCOPY N/A 01/07/2021    Procedure: urethroscopy;  Surgeon: Lowella Dandy, MD;  Location: [facility] MAIN OR;  Service: Urology    [place]     History reviewed. No pertinent family history.       Social History     Tobacco Use    Smoking status: Current Every Day Smoker     Packs/day: 0.50     Years: 57.00     Pack years: 28.50     Types: Cigarettes     Start date: 1964    Smokeless tobacco: Never Used   Substance Use Topics    Alcohol use: Not Currently    Drug use: Yes     Types: Marijuana     Living Situation     Questions Responses    Patient lives with     Homeless     Caregiver for other family member     External Services     Employment     Domestic Violence Risk                 Review of  Systems   Review of Systems   Constitutional: Positive for chills, diaphoresis, fatigue and fever.   HENT: Negative.    Eyes: Negative.    Respiratory: Negative.    Cardiovascular: Negative.    Gastrointestinal: Positive for nausea.   Endocrine: Negative.    Genitourinary: Positive for dysuria.        Bladder spasms   Musculoskeletal: Negative.    Skin: Negative.    Allergic/Immunologic: Negative.    Neurological: Negative.    Hematological: Negative.    Psychiatric/Behavioral: Negative.        Physical Exam     Triage Vitals  Triage Start: Start, (03/19/21 2209)   First Recorded BP: 120/79, Resp: (!) 26, Temp: 37.1 C (98.8 F), Temp src: Oral Oxygen Therapy SpO2:  98 %, Oximetry Source: Rt Hand, Heart Rate: 102, (03/19/21 2213)  .  First Pain Reported  0-10 Scale: 5, (03/19/21 2213)       Physical Exam  Vitals and nursing note reviewed.   Constitutional:       Appearance: He is well-developed and normal weight.   HENT:      Head: Normocephalic.      Mouth/Throat:      Mouth: Mucous membranes are moist.      Pharynx: Oropharynx is clear.   Eyes:      Extraocular Movements: Extraocular movements intact.      Pupils: Pupils are equal, round, and reactive to light.   Cardiovascular:      Rate and Rhythm: Regular rhythm. Tachycardia present.   Pulmonary:      Effort: Pulmonary effort is normal.      Breath sounds: Normal breath sounds.   Musculoskeletal:         General: Normal range of motion.      Cervical back: Normal range of motion and neck supple.   Skin:     General: Skin is warm and dry.      Capillary Refill: Capillary refill takes less than 2 seconds.   Neurological:      General: No focal deficit present.      Mental Status: He is alert and oriented to person, place, and time.   Psychiatric:         Mood and Affect: Mood normal.         Behavior: Behavior normal.         Medical Decision Making   Patient seen by me on:  03/19/2021    Assessment:  Craig Gilbert is a 65 year old male with a history of type 2  diabetes mellitus, anxiety, hypertension, PE, sciatica who had a elective cystoscopy and optical internal urethrotomy on 03/14/2021 by Dr. Izetta Dakin, urologist at [facility] in [place].  On arrival he is noted to be febrile, tachycardic with chills.    Differential diagnosis:  Sepsis  UTI  Pyelonephritis    Plan:  CBC, CMP  Blood cultures, lactic acid  Urinalysis    ED Course and Disposition:  Craig Gilbert is a 65 year old male with a history of type 2 diabetes mellitus, anxiety, hypertension, PE, sciatica who had a elective cystoscopy and optical internal urethrotomy on 03/14/2021 by Dr. Izetta Dakin, urologist at [facility] in [place].  Patient was noted to be febrile, tachycardic with chills concerning for sepsis.  Lab work, blood cultures, lactic acid and urinalysis are pending at this time.  Dr. Jennette Dubin will resume management of this patient.            Craig Gouty, NP      APP Review:    I was asked by APP to see this patient due to the complexity of the current medical presentation.      I personally saw the patient and performed a substantive portion of the medical decision making, exam and history.     I have reviewed and agree with the above documentation and, in addition:    The history is notable for This is a 65 year old male with history of diabetes, prior PE presenting 5 days after elective cystoscopy with generalized fatigue and weakness.  He also notes that he is coughing, short of breath, vomiting.  He reports having been febrile at home..    Exam is notable for Ill-appearing 65 year old male  Normal HEENT  Normal cardiopulmonary exam  No abdominal tenderness.    Patient at risk for UTI  Postop abscess  Pyelonephritis.    Our plan is Orders Placed This Encounter      Blood culture      Blood culture      Urinalysis reflex to culture      CT abdomen and pelvis with IV contrast      CBC and differential      Comprehensive metabolic panel      Urinalysis with reflex to Microscopic UA and  reflex to Bacterial Culture      Lactate, plasma      Lactate, plasma (CONDITIONAL)      Urine microscopic (iq200)      COVID-19 PCR      Insert peripheral IV    Ceftriaxone 1G  .          Additional attestation comments:  Patient received in signout from previous provider.  Notably CT of the abdomen pelvis demonstrates findings concerning for iliopsoas bursitis versus less likely abscess as well as findings suggestive of cystitis.  Given his leukocytosis as well as elevated lactate I believe it is reasonable to admit him for further monitoring and continued antibiotics.  Interestingly his urine shows only trace leukocyte esterase, though the CT has findings suggestive of cystitis. Zofran given for nausea as he is vomiting at this time.     Spoke with on-call Dr. Domenica Fail.  He agreed that the patient should be admitted for IV antibiotics    The patient notes that he is currently "miserable" and is requesting to leave [facility].  I explained the patient's CT results to him however he states that he does not trust the CT or CT reads done at this hospital because last time he was here there was an error made related to the read on his CT per his report.  As such he is requesting to leave at this time with "a prescription for antibiotics."  I explained that this was not adequate and that he risks sepsis, worsening condition, permanent disability and death.  He is accepting of these risks and leaves AGAINST MEDICAL ADVICE.      Author:  Jennette Dubin, MD       Sheilah Mins, NP  03/19/21 2248       Sheilah Mins, NP  03/19/21 2317       Jennette Dubin, MD  03/20/21 [phone]

## 2021-03-19 NOTE — Telephone Encounter (Signed)
Patient underwent CYSTOSCOPY with  internal uretheromy with Dr. Izetta Dakin 03/14/21.   He is scheduled for follow up and catheter removal in our office 03/21/21  Patient called complaining of pain in his penis 6/13, we discussed that the catheter generally isn't that comfortable, especially initially but he would need to keep it in until Friday. Advised to make sure catheter wasn't pulling etc.  I'm not sure what else to suggest to this patient. It's draining well.   Please advise, thanks Lovena Le

## 2021-03-19 NOTE — Progress Notes (Signed)
Pt calling - not feeing well.     Fevers to 103, chills shaking. Bladder spasms, bladder feeling full but very little output.     Asked pt to go to nearest Ed - .     They are welcome to call me through answering service/transfer center.     Recommend immediate IV broad spectrum abx and irrigation of catheter until draining freely.

## 2021-03-19 NOTE — ED Triage Notes (Signed)
Post opp on Friday stricture of a bladder urethra. Presents to day with n/v fever chills, feels like the bladder is full.

## 2021-03-20 ENCOUNTER — Inpatient Hospital Stay: Payer: Medicaid Other

## 2021-03-20 ENCOUNTER — Encounter: Payer: Self-pay | Admitting: Urology

## 2021-03-20 ENCOUNTER — Inpatient Hospital Stay
Admission: EM | Admit: 2021-03-20 | Discharge: 2021-03-25 | DRG: 720 | Disposition: A | Discharge status: Other Acute Inpatient Hospital | Payer: Medicaid Other | Source: Ambulatory Visit | Attending: Student in an Organized Health Care Education/Training Program | Admitting: Student in an Organized Health Care Education/Training Program

## 2021-03-20 ENCOUNTER — Inpatient Hospital Stay: Admit: 2021-03-20 | Discharge: 2021-03-20 | Disposition: A | Discharge status: Home / Self Care | Payer: Self-pay

## 2021-03-20 DIAGNOSIS — M5430 Sciatica, unspecified side: Secondary | ICD-10-CM | POA: Diagnosis present

## 2021-03-20 DIAGNOSIS — K429 Umbilical hernia without obstruction or gangrene: Secondary | ICD-10-CM

## 2021-03-20 DIAGNOSIS — E7800 Pure hypercholesterolemia, unspecified: Secondary | ICD-10-CM | POA: Diagnosis present

## 2021-03-20 DIAGNOSIS — R9431 Abnormal electrocardiogram [ECG] [EKG]: Secondary | ICD-10-CM

## 2021-03-20 DIAGNOSIS — I10 Essential (primary) hypertension: Secondary | ICD-10-CM | POA: Diagnosis present

## 2021-03-20 DIAGNOSIS — Z86718 Personal history of other venous thrombosis and embolism: Secondary | ICD-10-CM

## 2021-03-20 DIAGNOSIS — Z86711 Personal history of pulmonary embolism: Secondary | ICD-10-CM

## 2021-03-20 DIAGNOSIS — N132 Hydronephrosis with renal and ureteral calculous obstruction: Secondary | ICD-10-CM

## 2021-03-20 DIAGNOSIS — R531 Weakness: Secondary | ICD-10-CM

## 2021-03-20 DIAGNOSIS — Z20822 Contact with and (suspected) exposure to covid-19: Secondary | ICD-10-CM | POA: Diagnosis present

## 2021-03-20 DIAGNOSIS — R5383 Other fatigue: Secondary | ICD-10-CM

## 2021-03-20 DIAGNOSIS — F17210 Nicotine dependence, cigarettes, uncomplicated: Secondary | ICD-10-CM | POA: Diagnosis present

## 2021-03-20 DIAGNOSIS — Z794 Long term (current) use of insulin: Secondary | ICD-10-CM

## 2021-03-20 DIAGNOSIS — R7989 Other specified abnormal findings of blood chemistry: Secondary | ICD-10-CM

## 2021-03-20 DIAGNOSIS — G2581 Restless legs syndrome: Secondary | ICD-10-CM | POA: Diagnosis present

## 2021-03-20 DIAGNOSIS — N3289 Other specified disorders of bladder: Secondary | ICD-10-CM

## 2021-03-20 DIAGNOSIS — A4152 Sepsis due to Pseudomonas: Principal | ICD-10-CM | POA: Diagnosis present

## 2021-03-20 DIAGNOSIS — R638 Other symptoms and signs concerning food and fluid intake: Secondary | ICD-10-CM

## 2021-03-20 DIAGNOSIS — Z7901 Long term (current) use of anticoagulants: Secondary | ICD-10-CM

## 2021-03-20 DIAGNOSIS — D72829 Elevated white blood cell count, unspecified: Secondary | ICD-10-CM

## 2021-03-20 DIAGNOSIS — Z960 Presence of urogenital implants: Secondary | ICD-10-CM | POA: Diagnosis present

## 2021-03-20 DIAGNOSIS — L02419 Cutaneous abscess of limb, unspecified: Principal | ICD-10-CM

## 2021-03-20 DIAGNOSIS — R338 Other retention of urine: Secondary | ICD-10-CM | POA: Diagnosis not present

## 2021-03-20 DIAGNOSIS — M160 Bilateral primary osteoarthritis of hip: Secondary | ICD-10-CM

## 2021-03-20 DIAGNOSIS — L02416 Cutaneous abscess of left lower limb: Secondary | ICD-10-CM | POA: Diagnosis present

## 2021-03-20 DIAGNOSIS — M86152 Other acute osteomyelitis, left femur: Secondary | ICD-10-CM | POA: Diagnosis present

## 2021-03-20 DIAGNOSIS — B3749 Other urogenital candidiasis: Secondary | ICD-10-CM | POA: Diagnosis present

## 2021-03-20 DIAGNOSIS — Z789 Other specified health status: Secondary | ICD-10-CM

## 2021-03-20 DIAGNOSIS — R7402 Elevation of levels of lactic acid dehydrogenase (LDH): Secondary | ICD-10-CM

## 2021-03-20 DIAGNOSIS — M79652 Pain in left thigh: Secondary | ICD-10-CM

## 2021-03-20 DIAGNOSIS — I82412 Acute embolism and thrombosis of left femoral vein: Secondary | ICD-10-CM | POA: Diagnosis present

## 2021-03-20 DIAGNOSIS — F419 Anxiety disorder, unspecified: Secondary | ICD-10-CM | POA: Diagnosis present

## 2021-03-20 DIAGNOSIS — E1142 Type 2 diabetes mellitus with diabetic polyneuropathy: Secondary | ICD-10-CM | POA: Diagnosis present

## 2021-03-20 DIAGNOSIS — E785 Hyperlipidemia, unspecified: Secondary | ICD-10-CM | POA: Diagnosis present

## 2021-03-20 DIAGNOSIS — E669 Obesity, unspecified: Secondary | ICD-10-CM | POA: Diagnosis present

## 2021-03-20 DIAGNOSIS — Z6834 Body mass index (BMI) 34.0-34.9, adult: Secondary | ICD-10-CM

## 2021-03-20 LAB — CBC AND DIFFERENTIAL
Baso # K/uL: 0 10*3/uL (ref 0.0–0.1)
Basophil %: 0.1 %
Eos # K/uL: 0 10*3/uL (ref 0.0–0.5)
Eosinophil %: 0 %
Hematocrit: 44 % (ref 40–51)
Hemoglobin: 15.7 g/dL (ref 13.7–17.5)
IMM Granulocytes #: 0.1 10*3/uL — ABNORMAL HIGH (ref 0.0–0.0)
IMM Granulocytes: 0.7 %
Lymph # K/uL: 0.7 10*3/uL — ABNORMAL LOW (ref 1.3–3.6)
Lymphocyte %: 4.4 %
MCH: 30 pg (ref 26–32)
MCHC: 36 g/dL (ref 32–37)
MCV: 85 fL (ref 79–92)
Mono # K/uL: 1 10*3/uL — ABNORMAL HIGH (ref 0.3–0.8)
Monocyte %: 6.1 %
Neut # K/uL: 14.1 10*3/uL — ABNORMAL HIGH (ref 1.8–5.4)
Nucl RBC # K/uL: 0 10*3/uL (ref 0.0–0.0)
Nucl RBC %: 0 /100 WBC (ref 0.0–0.2)
Platelets: 174 10*3/uL (ref 150–330)
RBC: 5.2 MIL/uL (ref 4.6–6.1)
RDW: 12.3 % (ref 11.6–14.4)
Seg Neut %: 88.7 %
WBC: 15.9 10*3/uL — ABNORMAL HIGH (ref 4.2–9.1)

## 2021-03-20 LAB — APTT: aPTT: 25.9 s (ref 25.8–37.9)

## 2021-03-20 LAB — EKG 12-LEAD
P: 53 deg
PR: 149 ms
QRS: -25 deg
QRSD: 108 ms
QT: 394 ms
QTc: 467 ms
Rate: 84 {beats}/min
T: -74 deg

## 2021-03-20 LAB — BASIC METABOLIC PANEL
Anion Gap: 18 — ABNORMAL HIGH (ref 7–16)
CO2: 21 mmol/L (ref 20–28)
Calcium: 9.2 mg/dL (ref 8.6–10.2)
Chloride: 95 mmol/L — ABNORMAL LOW (ref 96–108)
Creatinine: 0.88 mg/dL (ref 0.67–1.17)
Glucose: 358 mg/dL — ABNORMAL HIGH (ref 60–99)
Lab: 17 mg/dL (ref 6–20)
Potassium: 3.5 mmol/L (ref 3.4–4.7)
Sodium: 134 mmol/L (ref 133–145)
eGFR BY CREAT: 97 *

## 2021-03-20 LAB — PERFORMING LAB

## 2021-03-20 LAB — PROTIME-INR
INR: 1.3 — ABNORMAL HIGH (ref 0.9–1.1)
Protime: 15.2 s — ABNORMAL HIGH (ref 10.0–12.9)

## 2021-03-20 LAB — COVID-19 NAAT (PCR): COVID-19 NAAT (PCR): NEGATIVE

## 2021-03-20 LAB — URINE MICROSCOPIC (IQ200): WBC,UA: >50 /hpf — AB (ref 0–5)

## 2021-03-20 LAB — LACTATE, PLASMA: Lactate: 2.9 mmol/L — ABNORMAL HIGH (ref 0.5–2.2)

## 2021-03-20 MED ORDER — GABAPENTIN 300 MG PO CAPSULE *I*
300.0000 mg | ORAL_CAPSULE | Freq: Every evening | ORAL | Status: DC
Start: 2021-03-20 — End: 2021-03-26
  Administered 2021-03-20 – 2021-03-25 (×6): 300 mg via ORAL
  Filled 2021-03-20 (×6): qty 1

## 2021-03-20 MED ORDER — SODIUM CHLORIDE 0.9 % IV BOLUS *I*
1000.0000 mL | Freq: Once | Status: AC
Start: 2021-03-20 — End: 2021-03-20
  Administered 2021-03-20: 1000 mL via INTRAVENOUS

## 2021-03-20 MED ORDER — SODIUM CHLORIDE 0.9 % 100 ML IV SOLN WRAPPED *I*
2000.0000 mg | Freq: Two times a day (BID) | INTRAMUSCULAR | Status: DC
Start: 2021-03-21 — End: 2021-03-21
  Administered 2021-03-21 (×2): 2000 mg via INTRAVENOUS
  Filled 2021-03-20 (×2): qty 20

## 2021-03-20 MED ORDER — DEXTROSE 50 % IV SOLN *I*
25.0000 g | INTRAVENOUS | Status: DC | PRN
Start: 2021-03-20 — End: 2021-03-26

## 2021-03-20 MED ORDER — SODIUM CHLORIDE 0.9 % 25 ML IV SOLN *I*
12.5000 mg | Freq: Four times a day (QID) | INTRAVENOUS | Status: DC | PRN
Start: 2021-03-20 — End: 2021-03-26
  Administered 2021-03-20 – 2021-03-21 (×3): 12.5 mg via INTRAVENOUS
  Filled 2021-03-20 (×3): qty 1

## 2021-03-20 MED ORDER — SODIUM CHLORIDE 0.9 % 100 ML IV SOLN WRAPPED *I*
1000.0000 mg | Freq: Once | Status: AC
Start: 2021-03-20 — End: 2021-03-20
  Administered 2021-03-20: 1000 mg via INTRAVENOUS
  Filled 2021-03-20: qty 10

## 2021-03-20 MED ORDER — KETOROLAC TROMETHAMINE 30 MG/ML IJ SOLN *I*
15.0000 mg | Freq: Once | INTRAMUSCULAR | Status: AC
Start: 2021-03-20 — End: 2021-03-20
  Administered 2021-03-20: 15 mg via INTRAVENOUS
  Filled 2021-03-20: qty 1

## 2021-03-20 MED ORDER — CLONAZEPAM 1 MG PO TABS *I*
1.0000 mg | ORAL_TABLET | Freq: Every evening | ORAL | Status: AC
Start: 2021-03-20 — End: 2021-03-22
  Administered 2021-03-20 – 2021-03-22 (×3): 1 mg via ORAL
  Filled 2021-03-20 (×3): qty 1

## 2021-03-20 MED ORDER — LISINOPRIL 5 MG PO TABS *I*
5.0000 mg | ORAL_TABLET | Freq: Every day | ORAL | Status: DC
Start: 2021-03-20 — End: 2021-03-26
  Administered 2021-03-20 – 2021-03-25 (×6): 5 mg via ORAL
  Filled 2021-03-20 (×6): qty 1

## 2021-03-20 MED ORDER — TAMSULOSIN HCL 0.4 MG PO CAPS *I*
0.4000 mg | ORAL_CAPSULE | Freq: Every evening | ORAL | Status: DC
Start: 2021-03-20 — End: 2021-03-22
  Administered 2021-03-20 – 2021-03-21 (×2): 0.4 mg via ORAL
  Filled 2021-03-20 (×2): qty 1

## 2021-03-20 MED ORDER — INSULIN REGULAR HUMAN 100 UNIT/ML IJ SOLN *I*
10.0000 [IU] | Freq: Once | INTRAMUSCULAR | Status: AC
Start: 2021-03-20 — End: 2021-03-20
  Administered 2021-03-20: 10 [IU] via SUBCUTANEOUS
  Filled 2021-03-20: qty 0.1

## 2021-03-20 MED ORDER — VANCOMYCIN HCL 2000 MG/400ML IV SOLN (ROOM TEMPERATURE) *I*
2000.0000 mg | Freq: Once | INTRAVENOUS | Status: AC
Start: 2021-03-20 — End: 2021-03-20
  Administered 2021-03-20: 2000 mg via INTRAVENOUS
  Filled 2021-03-20: qty 400

## 2021-03-20 MED ORDER — MORPHINE SULFATE 4 MG/ML IV SOLN *WRAPPED*
4.0000 mg | INTRAVENOUS | Status: DC | PRN
Start: 2021-03-20 — End: 2021-03-21
  Administered 2021-03-21 (×2): 4 mg via INTRAVENOUS
  Filled 2021-03-20 (×2): qty 1

## 2021-03-20 MED ORDER — CEFPODOXIME PROXETIL 200 MG PO TABS *I*
200.0000 mg | ORAL_TABLET | Freq: Two times a day (BID) | ORAL | 0 refills | Status: AC
Start: 2021-03-20 — End: 2021-03-27

## 2021-03-20 MED ORDER — ATORVASTATIN CALCIUM 20 MG PO TABS *I*
10.0000 mg | ORAL_TABLET | Freq: Every day | ORAL | Status: DC
Start: 2021-03-20 — End: 2021-03-26
  Administered 2021-03-20 – 2021-03-25 (×6): 10 mg via ORAL
  Filled 2021-03-20 (×6): qty 1

## 2021-03-20 MED ORDER — ONDANSETRON HCL 2 MG/ML IV SOLN *I*
4.0000 mg | Freq: Once | INTRAMUSCULAR | Status: AC
Start: 2021-03-20 — End: 2021-03-20
  Administered 2021-03-20: 4 mg via INTRAVENOUS
  Filled 2021-03-20: qty 2

## 2021-03-20 MED ORDER — PRAMIPEXOLE DIHYDROCHLORIDE 1 MG PO TABS *I*
1.5000 mg | ORAL_TABLET | Freq: Every evening | ORAL | Status: DC
Start: 2021-03-20 — End: 2021-03-26
  Administered 2021-03-20 – 2021-03-21 (×2): 1.5 mg via ORAL
  Administered 2021-03-22: 0.5 mg via ORAL
  Administered 2021-03-22: 1 mg via ORAL
  Administered 2021-03-23 – 2021-03-25 (×3): 1.5 mg via ORAL
  Filled 2021-03-20: qty 2
  Filled 2021-03-20: qty 1
  Filled 2021-03-20 (×9): qty 2

## 2021-03-20 MED ORDER — INSULIN LISPRO (HUMAN) 100 UNIT/ML IJ/SC SOLN *WRAPPED*
0.0000 [IU] | Freq: Three times a day (TID) | SUBCUTANEOUS | Status: DC
Start: 2021-03-21 — End: 2021-03-22
  Administered 2021-03-21 (×2): 4 [IU] via SUBCUTANEOUS
  Administered 2021-03-21: 3 [IU] via SUBCUTANEOUS
  Administered 2021-03-22: 5 [IU] via SUBCUTANEOUS

## 2021-03-20 MED ORDER — FLUOXETINE HCL 10 MG PO CAPS *I*
10.0000 mg | ORAL_CAPSULE | Freq: Every day | ORAL | Status: DC
Start: 2021-03-20 — End: 2021-03-26
  Administered 2021-03-20 – 2021-03-25 (×6): 10 mg via ORAL
  Filled 2021-03-20 (×6): qty 1

## 2021-03-20 MED ORDER — GLUCOSE 40 % PO GEL *I*
15.0000 g | ORAL | Status: DC | PRN
Start: 2021-03-20 — End: 2021-03-26

## 2021-03-20 MED ORDER — IOHEXOL 350 MG/ML (OMNIPAQUE) IV SOLN *I*
1.0000 mL | Freq: Once | INTRAVENOUS | Status: AC
Start: 2021-03-20 — End: 2021-03-20
  Administered 2021-03-20: 140 mL via INTRAVENOUS

## 2021-03-20 MED ORDER — LACTATED RINGERS IV SOLN *I*
75.0000 mL/h | INTRAVENOUS | Status: DC
Start: 2021-03-21 — End: 2021-03-21
  Administered 2021-03-21: 125 mL/h via INTRAVENOUS
  Administered 2021-03-21: 75 mL/h via INTRAVENOUS

## 2021-03-20 MED ORDER — GLUCAGON HCL (RDNA) 1 MG IJ SOLR *WRAPPED*
1.0000 mg | INTRAMUSCULAR | Status: DC | PRN
Start: 2021-03-20 — End: 2021-03-26

## 2021-03-20 MED ORDER — PRAMIPEXOLE DIHYDROCHLORIDE 0.25 MG PO TABS *I*
0.5000 mg | ORAL_TABLET | Freq: Three times a day (TID) | ORAL | Status: DC
Start: 2021-03-20 — End: 2021-03-20
  Administered 2021-03-20: 0.5 mg via ORAL
  Filled 2021-03-20: qty 2

## 2021-03-20 MED ORDER — ONDANSETRON HCL 2 MG/ML IV SOLN *I*
4.0000 mg | Freq: Four times a day (QID) | INTRAMUSCULAR | Status: DC | PRN
Start: 2021-03-20 — End: 2021-03-26
  Administered 2021-03-21 – 2021-03-22 (×2): 4 mg via INTRAVENOUS
  Filled 2021-03-20 (×2): qty 2

## 2021-03-20 MED ORDER — VANCOMYCIN IV - PHARMACIST TO DOSE PLACEHOLDER *I*
Status: DC
Start: 2021-03-21 — End: 2021-03-26

## 2021-03-20 NOTE — H&P (Signed)
General H&P for Inpatients    Chief Complaint: Bladder Spasms and left leg pain    History of Present Illness:  Craig Gilbert is a 65 year old male with a medical history of anxiety, Arthritis, Diabetes Mellitus, Type 2, HTN, Hyperlipidemia, Pulmonary Embolism, anticoagulated on Apixaban, Restless Leg Syndrome, and Sciatica.  March 14, 2021 had a cystoscopy at [facility] per Dr.Alam, Urologist.  March 19, 2021 came to the ED with a 2-3 day history of Bladder spasms, intermittent SOB, Generalized weakness, Fatigue, Nausea, Vomiting, Fever, Chills, Pain, and discomfort at foley catheter insertion site.  Abdominal/Pelvic CT Scan showed Bladder mual thickening and mild perivesicular stranding, suspicious for cystitis. Interval decrease in extent of now mild to moderate left hydroureteronephrosis with subtle inflammatory changes adjacent to the left UPJ.  Interval development of multilocualted fluid collection adjacent to the proximal left femur/lesser trochanter, likely representing iliopsoas bursitis Urinalysis shows 3 (+) blood, Trace Leuk Estrase, and WBC >50.  Has also had left hip pain, medial and lateral aspect of thigh, since November 2021.  Left AGAINST MEDICAL ADVICE.  Returns to the Emergency Department March 20, 2021 with continued symptoms, and is willing to be admitted.  Dr. Osker Mason, Orthopedic Surgeon was consulted, and plans to take patient to OR June 16, for wash out of left hip.  Left hip x-ray shows mild to moderate degenerative changes of the bilateral hips. 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  No acute osseous finding. Will admit to [facility] with possible abscess near left hip.  WBC 15.9  Yesterday's WBC 14.1  Lactate 2.9  INR 1.3. Resumed his Apixaban two days ago, because apixaban was on hold due to Cystoscopy March 14, 2021.    Scheduled to have Foley Catheter removed March 21, 2021.  Foley can be removed after OR Procedure March 21, 2021.    Past Medical  History:   Diagnosis Date    Anxiety     Arthritis     DM (diabetes mellitus)     High cholesterol     HTN (hypertension)     Hydronephrosis     Nerve pain     PE (pulmonary thromboembolism)     Restless leg syndrome     Sciatica     Urethral stricture     Urinary retention      Past Surgical History:   Procedure Laterality Date    PR CYSTOURETHROSCOPY N/A 01/07/2021    Procedure: urethroscopy;  Surgeon: Lowella Dandy, MD;  Location: [facility] MAIN OR;  Service: Urology    PR CYSTOURETHROSCOPY N/A 03/14/2021    Procedure: CYSTOSCOPY with  internal uretheromy;  Surgeon: Lowella Dandy, MD;  Location: [facility] MAIN OR;  Service: Urology    [place]     History reviewed. No pertinent family history.  Social History     Socioeconomic History    Marital status: Married     Spouse name: Not on file    Number of children: Not on file    Years of education: Not on file    Highest education level: Not on file   Tobacco Use    Smoking status: Current Every Day Smoker     Packs/day: 0.50     Years: 57.00     Pack years: 28.50     Types: Cigarettes     Start date: 1964    Smokeless tobacco: Never Used   Substance and Sexual Activity    Alcohol use: Not Currently  Drug use: Yes     Types: Marijuana    Sexual activity: Not on file   Other Topics Concern    Not on file   Social History Narrative    Not on file       Allergies: No Known Allergies (drug, envir, food or latex)    Medications Prior to Admission   Medication Sig    cefpodoxime (VANTIN) 200 mg tablet Take 1 tablet (200 mg total) by mouth every 12 hours for 7 days  for Pyelonephritis    phenazopyridine (PYRIDIUM) 100 MG tablet Take 1 tablet (100 mg total) by mouth 3 times daily as needed for Pain (pain/burning from catheter or in bladder)    tamsulosin (FLOMAX) 0.4 mg capsule Take 1 capsule (0.4 mg total) by mouth every evening  for Urinary Tract Stones    FLUoxetine (PROZAC) 10 mg capsule Take 1 capsule (10 mg total) by  mouth daily    clonazePAM (KLONOPIN) 1 mg tablet Take 1 tablet (1 mg total) by mouth 2 times daily  Max daily dose: 2 mg    methylPREDNISolone (MEDROL PAK) 4 MG tablet pack Take according to package directions (6 day supply) (Patient not taking: Reported on 03/07/2021)    glimepiride (AMARYL) 4 mg tablet Take 1 tablet (4 mg total) by mouth every morning    lisinopril (PRINIVIL,ZESTRIL) 5 mg tablet TAKE 1 TABLET BY MOUTH DAILY    pramipexole (MIRAPEX) 1 MG tablet Take 1 tablet (1 mg total) by mouth 3 times daily    gabapentin (NEURONTIN) 300 mg capsule Take 1 capsule (300 mg total) by mouth nightly  for Neuropathic Pain    apixaban (ELIQUIS) 2.5 mg tablet Take 1 tablet (2.5 mg total) by mouth every 12 hours    atorvastatin (LIPITOR) 10 mg tablet Take 10 mg by mouth daily      Current Facility-Administered Medications   Medication Dose Route Frequency    atorvastatin (LIPITOR) tablet 10 mg  10 mg Oral Daily    FLUoxetine (PROzac) capsule 10 mg  10 mg Oral Daily    gabapentin (NEURONTIN) capsule 300 mg  300 mg Oral Nightly    lisinopril (PRINIVIL,ZESTRIL) tablet 5 mg  5 mg Oral Daily    pramipexole (MIRAPEX) tablet 1.5 mg  1.5 mg Oral Nightly    tamsulosin (FLOMAX) 24 hr capsule 0.4 mg  0.4 mg Oral QPM    clonazePAM (KlonoPIN) tablet 1 mg  1 mg Oral Nightly    promethazine (PHENERGAN) 12.5 mg in sodium chloride 0.9% 25 mL IVPB  12.5 mg Intravenous Q6H PRN    ondansetron (ZOFRAN) injection 4 mg  4 mg Intravenous Q6H PRN    morphine 4 MG/ML injection 4 mg  4 mg Intravenous Q2H PRN    [START ON 03/21/2021] ceFEPIme (MAXIPIME) 2,000 mg in sodium chloride 0.9% 100 mL IVPB  2,000 mg Intravenous Q12H    [START ON 03/21/2021] Vancomycin - Pharmacist to Dose (admitted patients only)   Intravenous Pharmacist Managed Medication    [START ON 03/21/2021] Lactated Ringers Infusion  125 mL/hr Intravenous Continuous    [START ON 03/21/2021] insulin lispro (HumaLOG,ADMELOG) injection 0-20 units  0-20 units Subcutaneous  TID WC    dextrose (GLUTOSE) 40 % oral gel 15 g of Glucose  15 g of Glucose Oral PRN    And    dextrose 50% (0.5 g/mL) injection 25 g  25 g Intravenous PRN    And    glucagon (GLUCAGEN) injection 1 mg  1 mg Intramuscular  PRN       Review of Systems:   Review of Systems   Constitutional: Positive for chills, fever and malaise/fatigue.   HENT: Negative.    Eyes: Negative.    Respiratory: Negative.    Cardiovascular: Negative.    Gastrointestinal: Positive for nausea and vomiting.   Genitourinary: Negative.    Musculoskeletal:        Left hip pain   Skin: Negative.    Neurological: Negative.    Endo/Heme/Allergies: Negative.    Psychiatric/Behavioral: Negative.    All other systems reviewed and are negative.      Last Nursing documented pain:  0-10 Scale: 8 (03/20/21 2143)      Patient Vitals for the past 24 hrs:   BP Temp Temp src Pulse Resp SpO2 Height Weight   03/20/21 2200 155/81   80 (!) 27 96 %     03/20/21 2145 (!) 134/92   74 (!) 32 98 %     03/20/21 2130 (!) 151/100   87 (!) 34 98 %     03/20/21 2115 136/79   75 (!) 28 98 %     03/20/21 2100 140/89   73 19 93 %     03/20/21 2045 (!) 139/115 36.3 C (97.3 F) Oral 85 (!) 30 97 %  123.7 kg (272 lb 11.3 oz)   03/20/21 1919  37.2 C (99 F) Oral        03/20/21 1730 125/80          03/20/21 1715 125/80          03/20/21 1700 123/85          03/20/21 1515 129/81     91 %     03/20/21 1500 129/81     96 %     03/20/21 1445 133/79     90 %     03/20/21 1430 117/90     93 %     03/20/21 1415 (!) 133/104     93 %     03/20/21 1410 137/83   80 18 93 % 1.892 m (6' 2.5") 122.5 kg (270 lb)   03/20/21 1400 137/83     94 %              Physical Exam  Vitals and nursing note reviewed.   Constitutional:       Appearance: Normal appearance.   Cardiovascular:      Rate and Rhythm: Normal rate and regular rhythm.      Pulses: Normal pulses.      Heart sounds: Normal heart sounds.   Pulmonary:       Effort: Pulmonary effort is normal.      Breath sounds: Normal breath sounds.   Abdominal:      General: Bowel sounds are normal.      Palpations: Abdomen is soft.   Musculoskeletal:         General: Tenderness present.      Comments: Left hip tenderness.  Most discomfort is located lateral and medial aspect of left hip   Skin:     General: Skin is warm and dry.      Capillary Refill: Capillary refill takes 2 to 3 seconds.   Neurological:      General: No focal deficit present.      Mental Status: He is alert and oriented to person, place, and time.   Psychiatric:         Mood and Affect: Mood normal.  Behavior: Behavior normal.         Lab Results:   All labs in the last 24 hours:   Recent Results (from the past 24 hour(s))   Performing lab    Collection Time: 03/20/21  1:42 AM   Result Value Ref Range    Performing Lab see below    Basic metabolic panel    Collection Time: 03/20/21  2:56 PM   Result Value Ref Range    Glucose 358 (H) 60 - 99 mg/dL    Sodium 134 133 - 145 mmol/L    Potassium 3.5 3.4 - 4.7 mmol/L    Chloride 95 (L) 96 - 108 mmol/L    CO2 21 20 - 28 mmol/L    Anion Gap 18 (H) 7 - 16    UN 17 6 - 20 mg/dL    Creatinine 0.88 0.67 - 1.17 mg/dL    eGFR BY CREAT 97 *    Calcium 9.2 8.6 - 10.2 mg/dL   CBC and differential    Collection Time: 03/20/21  2:56 PM   Result Value Ref Range    WBC 15.9 (H) 4.2 - 9.1 THOU/uL    RBC 5.2 4.6 - 6.1 MIL/uL    Hemoglobin 15.7 13.7 - 17.5 g/dL    Hematocrit 44 40 - 51 %    MCV 85 79 - 92 fL    MCH 30 26 - 32 pg    MCHC 36 32 - 37 g/dL    RDW 12.3 11.6 - 14.4 %    Platelets 174 150 - 330 THOU/uL    Seg Neut % 88.7 %    Lymphocyte % 4.4 %    Monocyte % 6.1 %    Eosinophil % 0.0 %    Basophil % 0.1 %    Neut # K/uL 14.1 (H) 1.8 - 5.4 THOU/uL    Lymph # K/uL 0.7 (L) 1.3 - 3.6 THOU/uL    Mono # K/uL 1.0 (H) 0.3 - 0.8 THOU/uL    Eos # K/uL 0.0 0.0 - 0.5 THOU/uL    Baso # K/uL 0.0 0.0 - 0.1 THOU/uL    Nucl RBC % 0.0 0.0 - 0.2 /100 WBC    Nucl RBC # K/uL 0.0 0.0 -  0.0 THOU/uL    IMM Granulocytes # 0.1 (H) 0.0 - 0.0 THOU/uL    IMM Granulocytes 0.7 %   Protime-INR    Collection Time: 03/20/21  2:56 PM   Result Value Ref Range    Protime 15.2 (H) 10.0 - 12.9 sec    INR 1.3 (H) 0.9 - 1.1   APTT    Collection Time: 03/20/21  2:56 PM   Result Value Ref Range    aPTT 25.9 25.8 - 37.9 sec   Lactate, plasma    Collection Time: 03/20/21  2:56 PM   Result Value Ref Range    Lactate 2.9 (H) 0.5 - 2.2 mmol/L   COVID-19 PCR    Collection Time: 03/20/21  2:57 PM   Result Value Ref Range    COVID-19 Source Nasopharyngeal     COVID-19 PCR NEGATIVE NEG   Performing lab    Collection Time: 03/20/21  2:57 PM   Result Value Ref Range    Performing Lab see below    Blood culture    Collection Time: 03/20/21  3:01 PM    Specimen: Blood: aerobic/anaerobic; NO SITE PROVIDED   Result Value Ref Range    Bacterial Blood Culture .  Blood culture    Collection Time: 03/20/21  3:01 PM    Specimen: Blood: aerobic/anaerobic; NO SITE PROVIDED   Result Value Ref Range    Bacterial Blood Culture .        Radiology Impressions (last 3 days):  CT abdomen and pelvis with IV contrast    Result Date: 03/20/2021  Bladder mural thickening and mild perivesicular stranding, suspicious for cystitis. Recommend correlation with urinalysis. Interval decrease in extent of now mild to moderate left hydroureteronephrosis with subtle inflammatory changes adjacent to the left UPJ. This could also represent sequela of urinary tract infection. Interval development of multiloculated fluid collection adjacent to the proximal left femur/lesser trochanter, likely representing iliopsoas bursitis no additional etiologies including synovial inflammation are not excluded. Consider further evaluation with MRI on a nonemergent basis if clinically indicated. END OF IMPRESSION I have personally reviewed the images and the Resident's/Fellow's interpretation and agree with or edited the findings. [facility] submits this DICOM format image  data and final report to the [facility], an independent secure electronic health information exchange, on a reciprocally searchable basis (with patient authorization) for a minimum of 12 months after exam date.      Currently Active/Followed Hospital Problems:  Active Hospital Problems    Diagnosis     Leg abscess        Assessment: 65 year old male with Sepsis most likely related to abscess located near left femur.     Plan: Cefepime 2 GM IV q 12 hour    Vancomycin per Pharmacy dosing   NPO in anticipation for OR with Dr. Osker Mason, Orthopedic Surgeon.   Morphine 4 mg IV q 4h prn-pain   Zofran and Phenrgan as needed for nausea   IV Fluids: Lactated Ringers @ 176m/.hr    Anxiety   Continue home dose of clonazepam 1 mg po daily    Diabetes Mellitus   Check blood glucose aQC and HS, and cover with sliding scale as    Indicated   Received Regular Insulin 10 units SC in ED for BG 358.    HTN   Blood pressure stable 137/83   Continue home dose of Lisinopril 5 mg po daily    Hyperlipidemia   Continue home dose of Atorvastatin 10 mg po q hS    Indwelling Urinary Catheter s/p cystoscopy   Scheduled to be discontinued March 21, 2021   Will plan to remove Foley Catheter March 21, 2021 after procedure in OR    Pulmonary Embolism.   Hold Apixaban, in anticipation of going to OR March 21, 2021   INR 1.3    Restless Leg syndrome   Continue home dose of Pramipexole 1.5 mg po q HS    Author: DShanna Cisco NP  Note created: 03/20/2021  at: 11:39 PM

## 2021-03-20 NOTE — Discharge Instructions (Signed)
Today you were seen in the emergency department for evaluation as we discussed you have signs of a worsening urinary tract infection with potential spread of the infection to the muscles around your bladder.  We spoke with your urologist and everyone is in agreement that you will be best served by being admitted for IV antibiotics.  However at this time you are refusing and leaving [facility].  You understand that the risks associated with this include worsening of your condition, permanent disability, death.  We have sent a prescription for antibiotics to your pharmacy.  It is highly unlikely that these oral antibiotics will be adequate to treat your infection in its current condition.  We recommend you return to this hospital anytime should you change your mind.

## 2021-03-20 NOTE — ED Provider Notes (Signed)
History     Chief Complaint   Patient presents with    Urinary Catheter Problem     Patient is a 65 year old male, past medical history significant for diabetes, anxiety, hypertension, PE, sciatica, who presents to the emergency department requesting to be admitted.  Patient had an elective cystoscopy and optical internal urethrotomy on March 14, 2021 by Dr. Izetta Dakin, urologist, at [facility] in [place].  Patient reports moderate to severe left-sided upper thigh pain that he states tracks into the medial thigh.  He denies scrotal pain or swelling, abdominal pain.  Notably, patient does report generalized weakness and fatigue over the past several days and states he has spent most of them in bed as a result.  He also reports decreased appetite and vomiting.  Patient denies taking pain medication at home for the pain.    Patient states he has only taken two doses of Eliquis in the past week, his last dose two days ago.          Medical/Surgical/Family History     Past Medical History:   Diagnosis Date    Anxiety     Arthritis     DM (diabetes mellitus)     High cholesterol     HTN (hypertension)     Hydronephrosis     Nerve pain     PE (pulmonary thromboembolism)     Restless leg syndrome     Sciatica     Urethral stricture     Urinary retention         There is no problem list on file for this patient.           Past Surgical History:   Procedure Laterality Date    PR CYSTOURETHROSCOPY N/A 01/07/2021    Procedure: urethroscopy;  Surgeon: Lowella Dandy, MD;  Location: [facility] MAIN OR;  Service: Urology    TONSILLECTOMY AND ADENOIDECTOMY  1969     No family history on file.       Social History     Tobacco Use    Smoking status: Current Every Day Smoker     Packs/day: 0.50     Years: 57.00     Pack years: 28.50     Types: Cigarettes     Start date: 1964    Smokeless tobacco: Never Used   Substance Use Topics    Alcohol use: Not Currently    Drug use: Yes     Types: Marijuana     Living  Situation     Questions Responses    Patient lives with     Homeless     Caregiver for other family member     External Services     Employment     Domestic Violence Risk                 Review of Systems   Review of Systems   Constitutional: Positive for activity change, appetite change and fatigue. Negative for chills and fever.   HENT: Negative for rhinorrhea and sore throat.    Respiratory: Negative for cough and shortness of breath.    Cardiovascular: Negative for chest pain and palpitations.   Gastrointestinal: Positive for nausea and vomiting. Negative for abdominal pain and diarrhea.   Genitourinary: Negative for difficulty urinating, dysuria and hematuria.   Musculoskeletal: Negative for back pain and myalgias.        Reports left outer and inner upper thigh pain   Skin: Negative for pallor  and rash.   Neurological: Positive for weakness (generalized). Negative for numbness and headaches.   Psychiatric/Behavioral: Negative for confusion. The patient is not nervous/anxious.        Physical Exam     Triage Vitals      First Recorded  ,    .      Physical Exam  Constitutional:       General: He is not in acute distress.     Appearance: Normal appearance. He is ill-appearing. He is not toxic-appearing or diaphoretic.   HENT:      Head: Normocephalic and atraumatic.      Nose: No congestion.   Eyes:      General:         Right eye: No discharge.         Left eye: No discharge.      Extraocular Movements: Extraocular movements intact.   Cardiovascular:      Rate and Rhythm: Normal rate and regular rhythm.   Pulmonary:      Effort: Pulmonary effort is normal.      Breath sounds: Normal breath sounds. No wheezing.   Abdominal:      Palpations: Abdomen is soft.      Tenderness: There is no abdominal tenderness.   Musculoskeletal:         General: Normal range of motion.      Cervical back: Normal range of motion. No rigidity or tenderness.      Right lower leg: No edema.      Left lower leg: No edema.      Comments:  No left thigh tenderness to palpation   Skin:     General: Skin is warm and dry.      Capillary Refill: Capillary refill takes less than 2 seconds.   Neurological:      General: No focal deficit present.      Mental Status: He is alert and oriented to person, place, and time.   Psychiatric:         Mood and Affect: Mood normal.         Behavior: Behavior normal.         Medical Decision Making   Patient seen by me on:  03/20/2021    Assessment:  Patient is ill-appearing.  LUE non-tender to palpation and patient reports the pain is deep.    Differential diagnosis:  Known left upper extremity abscess; sepsis; severe sepsis; vital signs not supportive of septic shock; among others    Plan:  Repeat lab work and begin IV antibiotics    Independent review of: Existing labs    ED Course and Disposition:  On review of old records: Patient was seen in this emergency department yesterday for generalized weakness, nausea, and fatigue five days following an cystoscopy and optical internal urethrotomy on March 14, 2021.  Documentation suggests patient was ill-appearing and abdominal CT revealed findings concerning for iliopsoas bursitis versus less likely abscess as well as findings suggestive of cystitis. UA was negative for UTI. Urology was consulted and agreed with admission for IV antibiotics. However, patient left against medical advice. CT from March 2022 without evidence of fluid collection at left femur.    Formal CT Impression: "Interval development of multiloculated fluid collection adjacent to the proximal left femur/lesser trochanter measuring up to 9.3 x 9.1 cm on image [id number]. No underlying cortical destruction."    Patient was given IV vancomycin and cefepime in the emergency department.  Blood cultures were drawn  prior to the administration.  Patient does not meet SIRS criteria at this time.  See ED course notes; ultimately, patient was agreeable to admission.       ED Course as of 03/20/21 1918   Thu Mar 20, 2021    1612 Spoke with hospitalist, Dr. Dominica Severin, who requests ortho consult prior to accepting patient for admission.   1635 Extensive discussion with patient lasting greater than 20 minutes regarding importance of admission as definitive treatment. Patient requesting additional time to consider admission vs leaving AMA and "coming back Saturday for more antibiotics. 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 Jerolyn Center who states patient will require surgery with washout and drain placement. Willing to accept patient. Will speak with patient regarding his wishes.   1749 Patient remains undecided and is requesting to use a phone to talk to his sister.   [place] Patient now agreeable to admission.                Bennye Alm, DO        Critical Care    Acute interventions include eval pt's response to tx and review of old charts.    I personally spent 35 cumulative minutes performing critical care interventions to this patient as outlined above. This excludes separately billable procedures.     Additional critical care comments:  Multiple extensive discussions with patient regarding disposition as he remained reluctant for several hours.         Author:  Bennye Alm, DO       [place], [place]  03/20/21 2022

## 2021-03-20 NOTE — ED Triage Notes (Signed)
PT REPORTS A CATHETER INFECTION DIAGNOSED YESTERDAY AND TODAY FEELS WEAK AND TIRED.  PT HAS A CATHETER PLACED AFTER A URETHRAL SURGERY LAST Friday.  PT WAS IN THE ED LAST NIGHT BUT DECIDED TO LEAVE AMA.

## 2021-03-20 NOTE — Telephone Encounter (Signed)
Attempted to reach patient, no answer, unable to leave message  Vena Rua, [place]

## 2021-03-21 ENCOUNTER — Inpatient Hospital Stay: Payer: Medicaid Other

## 2021-03-21 ENCOUNTER — Ambulatory Visit: Payer: Medicaid Other | Admitting: Urology

## 2021-03-21 ENCOUNTER — Inpatient Hospital Stay: Admit: 2021-03-21 | Discharge: 2021-03-21 | Disposition: A | Discharge status: Home / Self Care | Payer: Self-pay

## 2021-03-21 DIAGNOSIS — R509 Fever, unspecified: Secondary | ICD-10-CM

## 2021-03-21 DIAGNOSIS — R112 Nausea with vomiting, unspecified: Secondary | ICD-10-CM

## 2021-03-21 DIAGNOSIS — Z01818 Encounter for other preprocedural examination: Secondary | ICD-10-CM

## 2021-03-21 DIAGNOSIS — R9431 Abnormal electrocardiogram [ECG] [EKG]: Secondary | ICD-10-CM

## 2021-03-21 DIAGNOSIS — L02416 Cutaneous abscess of left lower limb: Secondary | ICD-10-CM

## 2021-03-21 LAB — COMPREHENSIVE METABOLIC PANEL
ALT: 11 U/L (ref 0–50)
ALT: 10 U/L (ref 0–50)
AST: 11 U/L (ref 0–50)
AST: 9 U/L (ref 0–50)
Albumin: 3 g/dL — ABNORMAL LOW (ref 3.5–5.2)
Albumin: 3.1 g/dL — ABNORMAL LOW (ref 3.5–5.2)
Alk Phos: 112 U/L (ref 40–130)
Alk Phos: 103 U/L (ref 40–130)
Anion Gap: 11 (ref 7–16)
Anion Gap: 9 (ref 7–16)
Bilirubin,Total: 0.5 mg/dL (ref 0.0–1.2)
Bilirubin,Total: 0.4 mg/dL (ref 0.0–1.2)
CO2: 24 mmol/L (ref 20–28)
CO2: 22 mmol/L (ref 20–28)
Calcium: 8.4 mg/dL — ABNORMAL LOW (ref 8.6–10.2)
Calcium: 8.4 mg/dL — ABNORMAL LOW (ref 8.6–10.2)
Chloride: 99 mmol/L (ref 96–108)
Chloride: 102 mmol/L (ref 96–108)
Creatinine: 0.73 mg/dL (ref 0.67–1.17)
Creatinine: 0.66 mg/dL — ABNORMAL LOW (ref 0.67–1.17)
Glucose: 225 mg/dL — ABNORMAL HIGH (ref 60–99)
Glucose: 235 mg/dL — ABNORMAL HIGH (ref 60–99)
Lab: 17 mg/dL (ref 6–20)
Lab: 20 mg/dL (ref 6–20)
Potassium: 3.2 mmol/L — ABNORMAL LOW (ref 3.4–4.7)
Potassium: 3.9 mmol/L (ref 3.4–4.7)
Sodium: 134 mmol/L (ref 133–145)
Sodium: 133 mmol/L (ref 133–145)
Total Protein: 6.6 g/dL (ref 6.3–7.7)
Total Protein: 6.3 g/dL (ref 6.3–7.7)
eGFR BY CREAT: 102 *
eGFR BY CREAT: 105 *

## 2021-03-21 LAB — AEROBIC CULTURE: Aerobic Culture: 0

## 2021-03-21 LAB — POCT GLUCOSE
Glucose POCT: 220 mg/dL — ABNORMAL HIGH (ref 60–99)
Glucose POCT: 205 mg/dL — ABNORMAL HIGH (ref 60–99)
Glucose POCT: 162 mg/dL — ABNORMAL HIGH (ref 60–99)
Glucose POCT: 188 mg/dL — ABNORMAL HIGH (ref 60–99)

## 2021-03-21 LAB — CBC AND DIFFERENTIAL
Baso # K/uL: 0 10*3/uL (ref 0.0–0.1)
Basophil %: 0.1 %
Eos # K/uL: 0 10*3/uL (ref 0.0–0.5)
Eosinophil %: 0.1 %
Hematocrit: 41 % (ref 40–51)
Hemoglobin: 14.3 g/dL (ref 13.7–17.5)
IMM Granulocytes #: 0.1 10*3/uL — ABNORMAL HIGH (ref 0.0–0.0)
IMM Granulocytes: 0.9 %
Lymph # K/uL: 1 10*3/uL — ABNORMAL LOW (ref 1.3–3.6)
Lymphocyte %: 8.2 %
MCH: 30 pg (ref 26–32)
MCHC: 35 g/dL (ref 32–37)
MCV: 87 fL (ref 79–92)
Mono # K/uL: 0.8 10*3/uL (ref 0.3–0.8)
Monocyte %: 6.3 %
Neut # K/uL: 10.8 10*3/uL — ABNORMAL HIGH (ref 1.8–5.4)
Nucl RBC # K/uL: 0 10*3/uL (ref 0.0–0.0)
Nucl RBC %: 0 /100 WBC (ref 0.0–0.2)
Platelets: 157 10*3/uL (ref 150–330)
RBC: 4.7 MIL/uL (ref 4.6–6.1)
RDW: 12.3 % (ref 11.6–14.4)
Seg Neut %: 84.4 %
WBC: 12.7 10*3/uL — ABNORMAL HIGH (ref 4.2–9.1)

## 2021-03-21 LAB — LACTATE, PLASMA: Lactate: 1.4 mmol/L (ref 0.5–2.2)

## 2021-03-21 LAB — MAGNESIUM: Magnesium: 1.9 mg/dL (ref 1.6–2.5)

## 2021-03-21 MED ORDER — SODIUM CHLORIDE 0.9 % 100 ML IV SOLN WRAPPED *I*
2000.0000 mg | Freq: Three times a day (TID) | INTRAMUSCULAR | Status: DC
Start: 2021-03-22 — End: 2021-03-26
  Administered 2021-03-22 – 2021-03-25 (×12): 2000 mg via INTRAVENOUS
  Filled 2021-03-21 (×12): qty 20

## 2021-03-21 MED ORDER — ENOXAPARIN SODIUM 40 MG/0.4ML IJ SOSY *I*
40.0000 mg | PREFILLED_SYRINGE | INTRAMUSCULAR | Status: DC
Start: 2021-03-21 — End: 2021-03-21

## 2021-03-21 MED ORDER — ACETAMINOPHEN 325 MG PO TABS *I*
650.0000 mg | ORAL_TABLET | ORAL | Status: DC | PRN
Start: 2021-03-21 — End: 2021-03-26

## 2021-03-21 MED ORDER — INSULIN GLARGINE 100 UNIT/ML SC SOLN *WRAPPED*
5.0000 [IU] | Freq: Every evening | SUBCUTANEOUS | Status: DC
Start: 2021-03-21 — End: 2021-03-22
  Administered 2021-03-21: 5 [IU] via SUBCUTANEOUS
  Filled 2021-03-21: qty 0.05

## 2021-03-21 MED ORDER — HYDROCODONE-ACETAMINOPHEN 5-325 MG PO TABS *I*
1.0000 | ORAL_TABLET | Freq: Four times a day (QID) | ORAL | Status: DC | PRN
Start: 2021-03-21 — End: 2021-03-21
  Administered 2021-03-21: 1 via ORAL
  Filled 2021-03-21: qty 1

## 2021-03-21 MED ORDER — HYDROCODONE-ACETAMINOPHEN 5-325 MG PO TABS *I*
1.0000 | ORAL_TABLET | Freq: Four times a day (QID) | ORAL | Status: AC | PRN
Start: 2021-03-21 — End: 2021-03-24
  Administered 2021-03-22: 1 via ORAL
  Filled 2021-03-21: qty 1

## 2021-03-21 MED ORDER — POTASSIUM CHLORIDE CRYS CR 20 MEQ PO TBCR *I*
40.0000 meq | ORAL_TABLET | ORAL | Status: AC
Start: 2021-03-21 — End: 2021-03-21
  Administered 2021-03-21 (×3): 40 meq via ORAL
  Filled 2021-03-21 (×3): qty 2

## 2021-03-21 MED ORDER — PANTOPRAZOLE SODIUM 40 MG IV SOLR *I*
40.0000 mg | INTRAVENOUS | Status: DC
Start: 2021-03-21 — End: 2021-03-21
  Administered 2021-03-21: 40 mg via INTRAVENOUS
  Filled 2021-03-21: qty 10

## 2021-03-21 MED ORDER — HYDROCODONE-ACETAMINOPHEN 5-325 MG PO TABS *I*
2.0000 | ORAL_TABLET | Freq: Four times a day (QID) | ORAL | Status: DC | PRN
Start: 2021-03-21 — End: 2021-03-24
  Administered 2021-03-21 – 2021-03-24 (×8): 2 via ORAL
  Filled 2021-03-21 (×8): qty 2

## 2021-03-21 MED ORDER — ENOXAPARIN SODIUM 120 MG/0.8ML IJ SOSY *I*
120.0000 mg | PREFILLED_SYRINGE | Freq: Two times a day (BID) | INTRAMUSCULAR | Status: DC
Start: 2021-03-21 — End: 2021-03-22
  Administered 2021-03-21 – 2021-03-22 (×2): 120 mg via SUBCUTANEOUS
  Filled 2021-03-21 (×2): qty 0.8

## 2021-03-21 MED ORDER — PANTOPRAZOLE SODIUM 40 MG PO TBEC *I*
40.0000 mg | DELAYED_RELEASE_TABLET | Freq: Every morning | ORAL | Status: DC
Start: 2021-03-22 — End: 2021-03-26
  Administered 2021-03-22 – 2021-03-25 (×4): 40 mg via ORAL
  Filled 2021-03-21 (×4): qty 1

## 2021-03-21 MED ORDER — VANCOMYCIN HCL 1750 MG/350ML IV SOLN (ROOM TEMPERATURE) *I*
1750.0000 mg | Freq: Two times a day (BID) | INTRAVENOUS | Status: DC
Start: 2021-03-21 — End: 2021-03-22
  Administered 2021-03-21 (×2): 1750 mg via INTRAVENOUS
  Filled 2021-03-21 (×2): qty 350

## 2021-03-21 NOTE — Plan of Care (Signed)
Problem: Impaired ADL  Goal: Increase ADL Independence  Note: Patient to complete all ADLs safely and independently.     Problem: Impaired Mobility/Positioning  Goal: Increase Mobility/Positioning  Note: Patient to complete all functional mobility safely and independently.     Problem: Decreased Activity Tolerance  Goal: Increase Activity Tolerance  Note: Patient to increase bilateral UE endurance/functional activity tolerance by tolerating at least 15 minutes of UE exercise/activity to improve overall function with ADLs/mobility.

## 2021-03-21 NOTE — Provider Consult (Signed)
CC: Abscess of the left lower extremity    HPI: This is an orthopedic consultation note for Craig Gilbert is a 65 y.o. year old with what is a focal collection of fluid suggesting abscess near the lesser trochanter of the left femur.  He has battled this intermittently per his history for the last few months, came to the ED with some fevers sweats chills, admitted to ICU.  A CT scan was performed on his left hip/proximal femur, showing multiloculated fluid collection adjacent to left proximal femur/lesser trochanter.    Review of Systems:  Pertinent positives include left groin and inner thigh pain.    Past Medical History:  Past Medical History:   Diagnosis Date    Anxiety     Arthritis     DM (diabetes mellitus)     High cholesterol     HTN (hypertension)     Hydronephrosis     Nerve pain     PE (pulmonary thromboembolism)     Restless leg syndrome     Sciatica     Urethral stricture     Urinary retention         Past Surgical History:   Past Surgical History:   Procedure Laterality Date    PR CYSTOURETHROSCOPY N/A 01/07/2021    Procedure: urethroscopy;  Surgeon: Lowella Dandy, MD;  Location: [facility] MAIN OR;  Service: Urology    PR CYSTOURETHROSCOPY N/A 03/14/2021    Procedure: CYSTOSCOPY with  internal uretheromy;  Surgeon: Lowella Dandy, MD;  Location: [facility] MAIN OR;  Service: Urology    [place]        Laboratory Studies:     Recent Labs   Lab 03/21/21  0517   WBC 12.7*     No components found with this basename: HEMOGLOBIN, HEMATOCRIT, PLATELET       Recent Labs   Lab 03/21/21  1214   CO2 22     No components found with this basename: SODIUM, POTASSIUM, CHLORIDE, HCO3, BUN, CREATININE      Recent Labs   Lab 03/20/21  1456   INR 1.3*     No components found with this basename: APT      Family History: History reviewed. No pertinent family history.     Social history:   Social History     Socioeconomic History    Marital status: Married     Spouse name: Not on file     Number of children: Not on file    Years of education: Not on file    Highest education level: Not on file   Occupational History    Not on file   Tobacco Use    Smoking status: Current Every Day Smoker     Packs/day: 0.50     Years: 57.00     Pack years: 28.50     Types: Cigarettes     Start date: 1964    Smokeless tobacco: Never Used   Substance and Sexual Activity    Alcohol use: Not Currently    Drug use: Yes     Types: Marijuana    Sexual activity: Not on file   Social History Narrative    Not on file          Medications:    Scheduled Medications   Medication Dose Frequency Last Admin    Vancomycin HCl (VANOCIN) 1750 MG/350ML infusion 1,750 mg  1,750 mg Q12H 1,750 mg at 03/21/21 0507    pantoprazole (  PROTONIX) 4 mg/ml injection 40 mg  40 mg Q24H 40 mg at 03/21/21 1115    atorvastatin (LIPITOR) tablet 10 mg  10 mg Daily 10 mg at 03/21/21 0836    FLUoxetine (PROzac) capsule 10 mg  10 mg Daily 10 mg at 03/21/21 0836    gabapentin (NEURONTIN) capsule 300 mg  300 mg Nightly 300 mg at 03/20/21 2210    lisinopril (PRINIVIL,ZESTRIL) tablet 5 mg  5 mg Daily 5 mg at 03/21/21 0836    pramipexole (MIRAPEX) tablet 1.5 mg  1.5 mg Nightly 1.5 mg at 03/20/21 2203    tamsulosin (FLOMAX) 24 hr capsule 0.4 mg  0.4 mg QPM 0.4 mg at 03/20/21 2210    clonazePAM (KlonoPIN) tablet 1 mg  1 mg Nightly 1 mg at 03/20/21 2210    ceFEPIme (MAXIPIME) 2,000 mg in sodium chloride 0.9% 100 mL IVPB  2,000 mg Q12H 2,000 mg at 03/21/21 0430    insulin lispro (HumaLOG,ADMELOG) injection 0-9 units  0-9 units TID WC 4 units at 03/21/21 1132      PRN Medications   Medication Dose Frequency Last Admin    acetaminophen (TYLENOL) tablet 650 mg  650 mg Q4H PRN      HYDROcodone-acetaminophen (NORCO) 5-325 mg per tablet 1 tablet  1 tablet Q6H PRN      promethazine (PHENERGAN) 12.5 mg in sodium chloride 0.9% 25 mL IVPB  12.5 mg Q6H PRN 12.5 mg at 03/21/21 1125    ondansetron (ZOFRAN) injection 4 mg  4 mg Q6H PRN 4 mg at 03/21/21 0928     dextrose (GLUTOSE) 40 % oral gel 15 g of Glucose  15 g of Glucose PRN      And    dextrose 50% (0.5 g/mL) injection 25 g  25 g PRN      And    glucagon (GLUCAGEN) injection 1 mg  1 mg PRN          Allergies:   Allergy History as of 03/21/21      No Known Allergies (drug, envir, food or latex)                Vitals: BP: (103-155)/(61-115)   Temp:  [36.3 C (97.3 F)-37.2 C (99 F)]   Temp src: Oral (06/17 1200)  Heart Rate:  [68-90]   Resp:  [14-34]   SpO2:  [87 %-98 %]   Weight:  [123.7 kg (272 lb 11.3 oz)]      Exam:  General: Resting comfortably in the bed in no acute distress  Musculoskeletal: Minimal tenderness to the greater trochanter of the left thigh, no pain down the thigh or in the knee, there is tenderness to the medial aspect of the thigh near the lesser troch and in the adductor insertions.  There is full motion of the knee and hip but pain with passive abduction of the left hip.  Patient actively demonstrates flexed and abducted hip bilaterally without significant pain.  Bilateral calves soft and nontender.  Neurologic: Distal dorsiflexion and plantar flexion 5/5, sensation 5/5 bilaterally.    Radiologic Studies: I personally reviewed the Left hip x-rays and the CT of abdomen/pelvis, which demonsrate mild left hip degenerative disease and multiloculated collection of fluid approximately 9 cm in diameter near the lesser trochanter.    ASSESSMENT and PLAN: Case discussed with Dr. Osker Mason, we will pursue nonsurgical intervention, recommend IV antibiotics, will be started on IV vancomycin per the pharmacy dosing hospitalist managing.  Recommend PT and OT daily.  Azzie Almas, [place]  3:22 PM

## 2021-03-21 NOTE — Progress Notes (Signed)
Vancomycin Maintenance Regimen - Pharmacist to Dose    Patient is receiving Vancomycin   mg   for Urinary. Today is day 2 of therapy.    Goal vancomycin trough is 10-15 mcg/mL.    Laboratory Data      Lab results: 03/20/21  1456 03/19/21  2233 11/19/20  0852   WBC 15.9* 14.1* 7.1         Lab results: 03/20/21  1456 03/19/21  2233 01/02/21  1233   Creatinine 0.88 0.93 0.86         Lab results: 03/20/21  1456 03/19/21  2233 01/02/21  1233   UN 17 12 15      Vancomycin concentrations:   No results for input(s): VANC, VANCT in the last 8760 hours.    Assessment and Plan   Considering patient's renal function, volume status, clinical status, and reported concentrations, 1750 mg Q12H will obtain goal vancomycin concentration unless renal function or clinical status changes.    Next vancomycin concentration before the 4th or 5th dose, order placed to be drawn on 03/22/21 at 0400.    Pharmacist will follow for changes in renal function, toxicity, and efficacy and order serum concentrations and creatinine as needed.   Patient being treated for cather-associated infection post urethral surgery. WBC elevated at 15.9. Estimated trough of 15.9 with prospective regimen.    For questions contact pharmacy    Talbert Forest, PharmD

## 2021-03-21 NOTE — Progress Notes (Addendum)
Medical Nutrition Therapy - Initial Assessment    Admit Date: 03/20/2021  Reason for consult: Routine initial MNT assessment. MST Score 0.  Malnutrition Screening Tool score or >/=2 due to unplanned weight loss and/or reported eating or drinking poorly or nausea, vomiting, diarrhea for 7 days.    Patient Summary: Per NP progress note 03/20/21, active hospital problems include leg abscess.     Past Medical History:   Diagnosis Date    Anxiety     Arthritis     DM (diabetes mellitus)     High cholesterol     HTN (hypertension)     Hydronephrosis     Nerve pain     PE (pulmonary thromboembolism)     Restless leg syndrome     Sciatica     Urethral stricture     Urinary retention      Past Surgical History:   Procedure Laterality Date    PR CYSTOURETHROSCOPY N/A 01/07/2021    Procedure: urethroscopy;  Surgeon: Lowella Dandy, MD;  Location: [facility] MAIN OR;  Service: Urology    PR CYSTOURETHROSCOPY N/A 03/14/2021    Procedure: CYSTOSCOPY with  internal uretheromy;  Surgeon: Lowella Dandy, MD;  Location: [facility] MAIN OR;  Service: Urology    TONSILLECTOMY AND ADENOIDECTOMY  1969        Pertinent Social Hx: Married; Current every day cigarette smoker, No current alcohol use, Current marijuana use.     Pertinent Meds: Reviewed   Pertinent Labs: Reviewed; 03/21/21 Glucose 225H, Glucose POCT 220H, Potassium 3.2L, Calcium 8.4L    Reviewed I/O's  Enteral or parenteral access: None    Nutrition Hx: Hx of IDDM type 2, hypercholesterolemia, HTN. Patient had N/V since admission. Patient stated that he hasn't had a bowel movement since yesterday. He had watery diarrhea, threw up bile, hadn't eaten in several days and lost 4 lb  prior to admission. 03/21/21 Breakfast  meal intake <25% per nursing due to N/V. Patient stated that he would like to recieve diabetes-nutrition education/counseling before discharging because he has never received any. Patient appeared to be too uncomfortable for diabetes-nutrition education this morning,  RDN will re-attempt this afternoon. If patient is still too nauseous to participate, RDN will provide contact information for [facility] and [facility] department for outpatient services.     Food allergies: NKFA    Current diet: Consistent Carbohydrate  Supplements: Potassium chloride    Nutrition Focused Physical Exam:  Edema: None  Abdomen: Soft  Skin: Intact    Anthropometrics:  Height: 189.2 cm (6' 2.5")    Current Weight: 123.7 kg (272 lb 11.3 oz); 128% IBW; 99% UBW  UBW: 124.7 kg  Ideal Body Weight: 96.5 kg + 10%  BMI: 34.55 kg/(m^2) Obese, Class I  Weight Hx: No significant weight changes in last 30 days or 6 months noted.    Estimated Nutrient Needs: (Based on 123.7 kg)    2414 kcal/day (REE x 1.15 AF)   99 g protein/day (0.8 g/kg)    2414 mL fluid/day (1 mL/kcal)    Nutrition Assessment and Diagnosis:   1.    Altered nutrition-related laboratory values related to acute illness and hx of IDDM type 2 as evidenced by 03/21/21 Glucose 225H, Glucose POCT 220H, Potassium 3.2L, Calcium 8.4L.  2.    Food- and nutrition-related knowledge deficit related to lack of access to diabetes education/counseling as evidenced by uncontrolled IDDM type 2 and 03/21/21 Glucose 225H, Glucose POCT 220H.  3.  Obese, Class I related to excessive energy intake and/or decreased physical activity prior to acute illness as evidenced by BMI 34.55.  4.   Inadequate oral intake related to nausea and vomiting as evidenced by 03/21/21 Breakfast  meal intake <25% per nursing.    Nutrition Intervention:   1.   RDN will provide nutrition counseling and/or education PRN.   2.   Nutrition staff will obtain and honor food preferences in compliance with diet order.    Nutrition Monitoring/Evaluation:   1. Monitor via rounds: Diet tolerance and intake, nutrition-related labs, weight trend and BM pattern.   2. Nutrition to follow up per high nutrition risk protocol.

## 2021-03-21 NOTE — Progress Notes (Signed)
[facility] MEDICINE PROGRESS NOTE    SUBJECTIVE  Patient seen and examined at bedside in the morning.  Patient was evaluated by orthopedics who recommended nonoperative management.  Patient was not complaining of pain in the morning but had pain later on, initial blood cultures taken on first ER admission are growing Pseudomonas, sensitivities pending.    VITAL SIGNS  BP 112/62 (BP Location: Right arm)    Pulse 74    Temp 36.6 C (97.9 F) (Oral)    Resp 17    Ht 1.892 m (6' 2.5")    Wt 123.7 kg (272 lb 11.3 oz)    SpO2 94%    BMI 34.55 kg/m     Intake/Output Summary (Last 24 hours) at 03/21/2021 1838  Last data filed at 03/21/2021 1200  Gross per 24 hour   Intake 2742.13 ml   Output 1750 ml   Net 992.13 ml         PHYSICAL EXAM  Constitutional:   not in acute distress. Normal appearance.  Not  ill-appearing, toxic-appearing or diaphoretic.   HENT: Normocephalic and atraumatic.    Eyes: : Extraocular movements intact.  Pupils are equal, round, and reactive to light.   Cardiovascular:  Normal rate and regular rhythm. Normal pulses.  Normal heart sounds.   Pulmonary: Pulmonary effort is normal.  Air movement present.   Abdominal: Abdomen is flat.  Abdomen is soft. There is no right CVA tenderness or left CVA tenderness.   Musculoskeletal:   Normal range of motion.  Left hip pain and tenderness, mostly on the lateral aspect of the left hip, limited range of motion secondary to pain  Skin:Skin is warm and dry.  Capillary refill takes less than 2 seconds.   Neurological: No focal deficit present. He is alert and oriented to person, place, and time. No cranial nerve deficit.   Psychiatric:    Mood normal, Behavior normal.      Lab Results:   Recent Labs   Lab 03/21/21  0517 03/20/21  1456 03/19/21  2233   WBC 12.7* 15.9* 14.1*   Hemoglobin 14.3 15.7 16.7   Hematocrit 41 44 47   Platelets 157 174 199   Seg Neut % 84.4 88.7 87.8   Lymphocyte % 8.2 4.4 6.1   Monocyte % 6.3 6.1 4.9   Eosinophil % 0.1 0.0 0.7       Recent  Labs   Lab 03/21/21  1214 03/21/21  0517 03/20/21  1456   Sodium 133 134 134   Potassium 3.9 3.2* 3.5   CO2 _0 UN _1 Creatinine 0.66* 0.73 0.88   Glucose 235* 225* 358*   Calcium 8.4* 8.4* 9.2       Recent Labs   Lab 03/20/21  1456   INR 1.3*   aPTT 25.9   Protime 15.2*     No components found with this basename: APTT      Recent Labs   Lab 03/21/21  1214 03/21/21  0517 03/19/21  2233   Alk Phos 103 112 145*   Bilirubin,Total 0.4 0.5 0.7   Albumin 3.1* 3.0* 4.2   ALT _2 AST _3 Total Protein 6.3 6.6 8.1*       Glucose       Lab results: 03/21/21  1706 03/21/21  1214 03/21/21  1118 03/21/21  0805 03/21/21  0517 03/20/21  1456 03/19/21  2233 03/14/21  4235  01/02/21  1233   Glucose  --  235*  --   --  225* 358* 285*  --  281*   Glucose POCT 162*  --  205* 220*  --   --   --  298*  --        Lab Results   Component Value Date    HA1C 10.4 (H) 11/19/2020                                             Imaging/Diagnostics (last 3 days)     CT abdomen and pelvis with IV contrast    Result Date: 03/20/2021  Bladder mural thickening and mild perivesicular stranding, suspicious for cystitis. Recommend correlation with urinalysis. Interval decrease in extent of now mild to moderate left hydroureteronephrosis with subtle inflammatory changes adjacent to the left UPJ. This could also represent sequela of urinary tract infection. Interval development of multiloculated fluid collection adjacent to the proximal left femur/lesser trochanter, likely representing iliopsoas bursitis no additional etiologies including synovial inflammation are not excluded. Consider further evaluation with MRI on a nonemergent basis if clinically indicated. END OF IMPRESSION I have personally reviewed the images and the Resident's/Fellow's interpretation and agree with or edited the findings. [facility] submits this DICOM format image data and final report to the [facility], an independent secure electronic health  information exchange, on a reciprocally searchable basis (with patient authorization) for a minimum of 12 months after exam date.    *Chest STANDARD single view    Result Date: 03/21/2021  Normal chest x-ray END OF IMPRESSION [facility] submits this DICOM format image data and final report to the [facility], an independent secure electronic health information exchange, on a reciprocally searchable basis (with patient authorization) for a minimum of 12 months after exam date.            [facility] Problems    Diagnosis     Leg abscess          CURRENT MEDICATIONS    SCHEDULED MEDS:   vancomycin  1,750 mg Intravenous Q12H    pantoprazole  40 mg Intravenous Q24H    atorvastatin  10 mg Oral Daily    FLUoxetine  10 mg Oral Daily    gabapentin  300 mg Oral Nightly    lisinopril  5 mg Oral Daily    pramipexole  1.5 mg Oral Nightly    tamsulosin  0.4 mg Oral QPM    clonazePAM  1 mg Oral Nightly    ceFEPime (MAXIPIME) IV  2,000 mg Intravenous Q12H    insulin lispro  0-9 units Subcutaneous TID WC       CONTINUOUS INFUSIONS:   Vancomycin - Pharmacist to Dose         PRN MEDS:.acetaminophen, HYDROcodone-acetaminophen, HYDROcodone-acetaminophen, promethazine, ondansetron, Nursing communication- Give 4 OZ of fruit juice for BG < 70 mg/dl **AND** dextrose **AND** dextrose **AND** glucagon **AND** POCT glucose         ASSESSMENT AND PLAN    Sepsis secondary to abscess at left femur(tachypnea, leukocytosis, source of infection present)  Pseudomonas bacteremia/septicemia  -Continue with broad-spectrum antibiotics, Vanco and cefepime  -Orthopedic consulted, recommends conservative management for now  -Follow repeat blood cultures, initial blood cultures growing Pseudomonas, will follow sensitivities  -Follow catheter tip cultures  -Order repeat blood cultures if patient has fever  Anxiety              Continue home dose of clonazepam 1 mg po daily    Diabetes Mellitus              Accu-Cheks  AC at bedtime, basal bolus regimen    HTN              Blood pressure stable 137/83              Continue home dose of Lisinopril 5 mg po daily    Hyperlipidemia              Continue home dose of Atorvastatin 10 mg po q hS    Indwelling Urinary Catheter s/p cystoscopy  -Discontinue Foley catheter, follow-up with urology as an outpatient    Pulmonary Embolism-on anticoagulation with apixaban  -We will hold apixaban and will give Lovenox in case patient needs surgical procedure    Restless Leg syndrome              Continue home dose of Pramipexole 1.5 mg po q HS    DVT prophylaxis: Lovenox      Code Status:   Code Status Information     Code Status Advance Care Planning    Full Code Jump to the Activity           DISPOSITION:Home      AUTHOR: Wray Kearns, MBBS

## 2021-03-21 NOTE — Progress Notes (Signed)
Patient Name: JANE BROUGHTON   DOB: Mar 15, 1956     Subjective: Patient was agreeable to a PT evaluation with encouragement. Stated his left hip is very painful.     Vitals: BP 145/85, pulse 85 bpm, and SpO2 96% on 2L O2 via nasal cannula     PT Treatment - 03/21/21 1100        Prior Living     Prior Living Situation Reported by patient     Lives With Alone     Type of Home --   One story    # Steps to [place] 3     # Rails to Enter Home 1   Left ascending    Medical Equipment in [facility]     Additional Comments Patient sleeps in a non-adjustable bed without side rails        Prior Function Level    Prior Function Level Reported by patient     Transfers Independent     Transfer Devices None     Walking Independent     Walking assistive devices used None     Stair negotiation Independent        PT Tracking    PT TRACKING PT Eval Attempted        Precautions/Observations    Precautions used Yes     LDA Observation O2 (comment)   2L O2 via nasal cannula     Isolation Precautions None     Was patient wearing a mask? No     PPE worn by writer [facility]     Fall Precautions General falls precautions        Pain Assessment    *Is the patient currently in pain? Yes     Pain (Before,During, After) Therapy Before     0-10 Scale 8     Pain Location Hip     Pain Orientation Left     Additional comments RN in room intermittently throughout session. RN administered pain medication.         Cognition    Orientation Alert and oriented x3     Ability to Follow Instructions Follows all commands and directions without difficulty        LE Assessment    Assessment Focus Range of Motion;Strength        LLE (degrees)    Overall ROM ROM WFL        RLE (degrees)    Overall ROM ROM WFL        LLE Strength    Hip Flexion 3/5   Painful    Knee Flexion 4/5     Knee Extension 4/5     Ankle Dorsiflexion 5/5        RLE Strength    Hip Flexion 5/5     Knee Flexion 5/5     Knee Extension 5/5     Ankle Dorsiflexion 5/5        Bed Mobility     Supine to Sit Supervision     Sit to Supine Supervision     Additional comments HOB elevated; side rails up. Patient used side rails to assist with bed mobility.        Transfers    Sit to Stand Contact guard     Stand to sit Contact guard     Additional comments Patient reported lightheadedness and dizzines due to pain medication. Patient was returned to a supine position. RN in room and  acknowledged the patient's symptoms.  Mobility    Mobility Not tested (comment)     Additional comments Due to patient's report of lightheadedness and dizziness.         Family/Caregiver Training`    Patient/Family/Caregiver training Yes     Patient training Role of physical therapy in hospital and plan for evaluation and follow up                 Assessment: Full PT evaluation was unable to be completed as the patient reported lightheadedness and dizziness after his pain medication was administered. Patient's RN was intermittently in the room throughout session and was in room when the patient reported symptoms. End of attempted evaluation, the patient was resting comfortably in bed with call bell in reach, all needs met, and RN in room.     Jake Church, PT

## 2021-03-21 NOTE — Progress Notes (Signed)
Report received. Patient appears to rest comfortably in bed. EKG in to see patient.   Dr. Osker Mason in to see patient.   Assessment started. Medications administered. Patient given breakfast tray. Patient reports mild nausea with PO intake. Requests toast instead of tray. Patient given toast.   Dr. Red Christians in to see patient. Plan of care reviewed. Orders received.   Patient reports increased nausea. Zofran administered as ordered. MD notified.   Patient spouse, Pamala Hurry, called unit, update given.   Patient reports heartburn followed by a build up in the chest with nausea. MD notified.   PT and OT in to see patient. Patient reports severe pain. PRN morphine administered as ordered.   Patient reports increased nausea. PRN Phenergan administered as ordered.   Patient noted to participated in physical therapy.  Patient appears to sleep comfortably in bed. Easily awoken. Patient given lunch tray and is noted to feed self independently.   Spouse, Pamala Hurry, in to visit.   Patients sister, Jackelyn Poling, to unit. Per patient request, sister given patients keys to care for patients cat.   Dr. Red Christians to unit. Update given plan of care reviewed.   Medications administered. Foley catheter drained. Remains patent.   Patient reports he believes left hip pain originated from injury in [place] and has worsened in the past 60-90 days. States he has been seeing a Restaurant manager, fast food for feeling of dislocation. States chiropractor reports a problem with the left hip. MD notified.   Patient requested and received PRN pain medication. Patient encouraged to request pain medications prior to severe pain levels. Patient able to verbalize understanding. FSBS done. Insulin administered. Patient given dinner tray and denies further needs.   Patient noted to take small bites of dinner and requests tray be left at bedside. Denies need for nausea medications at this time. Patient reports continued pain, denies need for pain medication at this time.  Dr.  Red Christians notified of positive blood cultures.  Dr. Red Christians to unit. MD notified of continued pain. Orders received.  Foley catheter removed as ordered. Tip sent for culture. Patient denies further needs.   Verbal report given to oncoming RN.

## 2021-03-21 NOTE — Progress Notes (Signed)
Report received at beginning of shift. Pt resting quietly in bed, denies needs at this time. Pt states his pain is tolerable at this time and denies need for intervention. Please see assessment for further information

## 2021-03-21 NOTE — Progress Notes (Signed)
Occupational Therapy Evaluation    Discharge recommendation:  Prior Living Environment   Equipment recommendations:   none at this time  Hospital Stay Recommendations for nursing:  encourage independence    Patient Name: Craig Gilbert  Date of Birth: 04-Apr-1956  MRN#: [id number]  Admission Date: 03/20/2021  Date of Service: 03/21/2021    Initial Eval Completed. Patient is a 65 y.o.male who was admitted for left hip pain. Pt was referred to OT for evaluation and tx. Pt currently presents with significant pain and light headedness at present.  Pt will benefit from restorative OT services in the acute care setting to max safety and functional independence with ADLs to achieve PLOF and ensure safe discharge planning.     Treatment today consisted of: evaluation    Recommendations for discharge include: return to prior living environment when medically appropriate.    OT Prognosis: good    Previous Therapy: n/a    Admitting Diagnosis: leg abscess    Reason for Referral: Decline in Function and [facility] Course of Testing & Treatment:   CT abdomen and pelvis with IV contrast    Result Date: 03/20/2021  Bladder mural thickening and mild perivesicular stranding, suspicious for cystitis. Recommend correlation with urinalysis. Interval decrease in extent of now mild to moderate left hydroureteronephrosis with subtle inflammatory changes adjacent to the left UPJ. This could also represent sequela of urinary tract infection. Interval development of multiloculated fluid collection adjacent to the proximal left femur/lesser trochanter, likely representing iliopsoas bursitis no additional etiologies including synovial inflammation are not excluded. Consider further evaluation with MRI on a nonemergent basis if clinically indicated. END OF IMPRESSION I have personally reviewed the images and the Resident's/Fellow's interpretation and agree with or edited the findings. [facility] submits this DICOM format image data and  final report to the [facility], an independent secure electronic health information exchange, on a reciprocally searchable basis (with patient authorization) for a minimum of 12 months after exam date.    *Chest STANDARD single view    Result Date: 03/21/2021  Normal chest x-ray END OF IMPRESSION [facility] submits this DICOM format image data and final report to the [facility], an independent secure electronic health information exchange, on a reciprocally searchable basis (with patient authorization) for a minimum of 12 months after exam date.        Physician Orders: evaluate and treat    Subjective: Patient reports an 8/10 pain level in his left hip and is getting pain medication.    Patient Goal: to return home     Past Medical History:   Diagnosis Date    Anxiety     Arthritis     DM (diabetes mellitus)     High cholesterol     HTN (hypertension)     Hydronephrosis     Nerve pain     PE (pulmonary thromboembolism)     Restless leg syndrome     Sciatica     Urethral stricture     Urinary retention      Past Surgical History:   Procedure Laterality Date    PR CYSTOURETHROSCOPY N/A 01/07/2021    Procedure: urethroscopy;  Surgeon: Lowella Dandy, MD;  Location: [facility] MAIN OR;  Service: Urology    PR CYSTOURETHROSCOPY N/A 03/14/2021    Procedure: CYSTOSCOPY with  internal uretheromy;  Surgeon: Lowella Dandy, MD;  Location: [facility] MAIN OR;  Service: Urology    [place]       *  Bold Indicates co-morbidities affecting treatment and recovery    Occupational profile relating to the present problem:   Lives alone    Modification of tasks needed or assistance with assessments necessary to enable completion of evaluation component: limited today by pain level and feeling light headed    Patient complexity:  low level as indicated by  personal factors, environmental factors and comorbidities in addition to their impairments found on physical exam.       OT Assessment ([place] / [facility]) -  03/21/21 1148        OT Tracking  ([place] / [facility])    OT Tracking ([place] / [facility]) OT Assigned     Type of Session evaluation        Precautions    Precautions used Yes     Writer wearing PPE including [place] (Prior to Admission)    Prior Living Situation Reported by patient     Type of Home [facility]     Location of Bedroom First floor     Location of Bathroom First floor     # Steps to Enter Home 4     Bathroom Shower/Tub Tub/Shower unit        Prior Function    Prior Function Reported by patient     Level of Independence Independent with ADLs;Independent ambulation;Independent with homemaking with ambulation;Driving     Lives With Alone     IADL Independent     Additional Comments He admits to having a bit more trouble recently doing everything for himself as far as cleaning/housework.  He reports having a cane but that most of the time he ambulates with no assistive device.          Pain Assessment    *Is the patient currently in pain? Yes     Pain Location 1 Hip     Pain Scale 1 8     Additional Comments RN Jarrett Soho administering morphine and phenergen to patient currently.        UE Assessment    UE Assessment Full AROM RUE;Full AROM LUE     Additional Comments Patient exhibits good functional UE strength, but reports being weaker than his baseline.  He presents with gross bilateral UE strength at 4+/5 MMT.         Bed Mobility    Supine to Sit Independent     Sit to Supine Independent        Functional Transfers    Sit to Stand Independent     Stand to Sit Independent        ADL Assessment    Additional Comments Full ADL assessment not completed at this time.  Patient refuses participating in ADL tasks at this time, due to his high hip pain, light headedness, etc. currently.  He reports increased pain at hip when bending also.  Will re-attempt further ADL assessment during treatment as needed.        Activity Tolerance    Additional Comments Bilateral UE endurance is limited at this time, primarily by his  hip pain.        OT Treatment Assessment    Additional Comments Patient presents for hospitalization and would benefit from OT to address all related deficits to ensure a safe discharge home when appropriate.        Plan    OT Frequency 3-5x/wk     Patient Will Benefit From ADL retraining;Functional transfer training;UE strengthening/ROM;Endurance  training        Recommendation    OT Discharge Recommendations Prior Living Environment                   OT Care Plan Notes     No notes found.             Adaptive equipment given to patient: None    Thank you for your referral.   Leanord Hawking, OT    Plan of Care     The physician's co-signature on this note indicates that they have reviewed this evaluation and agree with the documented goals and plan of care.

## 2021-03-21 NOTE — Progress Notes (Signed)
Patient was still too tired and uncomfortable. RDN provided patient with diabetes-nutrition education handouts and telephone number for [facility] Diabetes Education and Outreach program.

## 2021-03-21 NOTE — Progress Notes (Signed)
Patient reported pain to L leg at 8/10. Call to provider Baruch Gouty NP to discuss, new order obtained to give 10 mg norco now. Education on pain management provided to patient, encourage to call out when his pain is a 3-4 instead of waiting until it is intolerable for him. Verbalized understanding

## 2021-03-21 NOTE — Telephone Encounter (Signed)
Patient is currently admitted at [facility] [place]

## 2021-03-22 ENCOUNTER — Inpatient Hospital Stay: Payer: Medicaid Other

## 2021-03-22 ENCOUNTER — Inpatient Hospital Stay: Admit: 2021-03-22 | Discharge: 2021-03-22 | Disposition: A | Discharge status: Home / Self Care | Payer: Self-pay

## 2021-03-22 ENCOUNTER — Encounter: Payer: Self-pay | Admitting: Student in an Organized Health Care Education/Training Program

## 2021-03-22 DIAGNOSIS — N3289 Other specified disorders of bladder: Secondary | ICD-10-CM

## 2021-03-22 DIAGNOSIS — M1712 Unilateral primary osteoarthritis, left knee: Secondary | ICD-10-CM

## 2021-03-22 DIAGNOSIS — M71352 Other bursal cyst, left hip: Secondary | ICD-10-CM

## 2021-03-22 DIAGNOSIS — M1612 Unilateral primary osteoarthritis, left hip: Secondary | ICD-10-CM

## 2021-03-22 DIAGNOSIS — M47816 Spondylosis without myelopathy or radiculopathy, lumbar region: Secondary | ICD-10-CM

## 2021-03-22 DIAGNOSIS — M898X5 Other specified disorders of bone, thigh: Secondary | ICD-10-CM

## 2021-03-22 LAB — BASIC METABOLIC PANEL
Anion Gap: 8 (ref 7–16)
CO2: 22 mmol/L (ref 20–28)
Calcium: 8.1 mg/dL — ABNORMAL LOW (ref 8.6–10.2)
Chloride: 101 mmol/L (ref 96–108)
Creatinine: 0.76 mg/dL (ref 0.67–1.17)
Glucose: 235 mg/dL — ABNORMAL HIGH (ref 60–99)
Lab: 18 mg/dL (ref 6–20)
Potassium: 4.1 mmol/L (ref 3.4–4.7)
Sodium: 131 mmol/L — ABNORMAL LOW (ref 133–145)
eGFR BY CREAT: 101 *

## 2021-03-22 LAB — CBC
Hematocrit: 39 % — ABNORMAL LOW (ref 40–51)
Hemoglobin: 13.4 g/dL — ABNORMAL LOW (ref 13.7–17.5)
MCH: 31 pg (ref 26–32)
MCHC: 35 g/dL (ref 32–37)
MCV: 89 fL (ref 79–92)
Platelets: 144 10*3/uL — ABNORMAL LOW (ref 150–330)
RBC: 4.3 MIL/uL — ABNORMAL LOW (ref 4.6–6.1)
RDW: 12.3 % (ref 11.6–14.4)
WBC: 9.2 10*3/uL — ABNORMAL HIGH (ref 4.2–9.1)

## 2021-03-22 LAB — POCT GLUCOSE
Glucose POCT: 243 mg/dL — ABNORMAL HIGH (ref 60–99)
Glucose POCT: 194 mg/dL — ABNORMAL HIGH (ref 60–99)
Glucose POCT: 220 mg/dL — ABNORMAL HIGH (ref 60–99)
Glucose POCT: 229 mg/dL — ABNORMAL HIGH (ref 60–99)

## 2021-03-22 LAB — VANCOMYCIN, TROUGH: Vancomycin Trough: 9.7 ug/mL — ABNORMAL LOW (ref 10.0–20.0)

## 2021-03-22 MED ORDER — INSULIN GLARGINE 100 UNIT/ML SC SOLN *WRAPPED*
8.0000 [IU] | Freq: Every evening | SUBCUTANEOUS | Status: DC
Start: 2021-03-22 — End: 2021-03-22

## 2021-03-22 MED ORDER — TAMSULOSIN HCL 0.4 MG PO CAPS *I*
0.8000 mg | ORAL_CAPSULE | Freq: Every evening | ORAL | Status: DC
Start: 2021-03-22 — End: 2021-03-26
  Administered 2021-03-22 – 2021-03-25 (×4): 0.8 mg via ORAL
  Filled 2021-03-22 (×4): qty 2

## 2021-03-22 MED ORDER — INSULIN GLARGINE 100 UNIT/ML SC SOLN *WRAPPED*
10.0000 [IU] | Freq: Every evening | SUBCUTANEOUS | Status: DC
Start: 2021-03-22 — End: 2021-03-23
  Administered 2021-03-22: 10 [IU] via SUBCUTANEOUS
  Filled 2021-03-22: qty 0.1

## 2021-03-22 MED ORDER — IOHEXOL 350 MG/ML (OMNIPAQUE) IV SOLN 500ML BOTTLE *I*
1.0000 mL | Freq: Once | INTRAVENOUS | Status: AC
Start: 2021-03-22 — End: 2021-03-22
  Administered 2021-03-22: 140 mL via INTRAVENOUS

## 2021-03-22 MED ORDER — VANCOMYCIN HCL 1500 MG/300ML IV SOLN (ROOM TEMPERATURE) *I*
1500.0000 mg | Freq: Three times a day (TID) | INTRAVENOUS | Status: DC
Start: 2021-03-22 — End: 2021-03-26
  Administered 2021-03-22 – 2021-03-25 (×11): 1500 mg via INTRAVENOUS
  Filled 2021-03-22 (×11): qty 300

## 2021-03-22 MED ORDER — LIDOCAINE HCL 2 % UROJET *I*
CUTANEOUS | Status: AC
Start: 2021-03-22 — End: 2021-03-22
  Filled 2021-03-22: qty 5

## 2021-03-22 MED ORDER — ENOXAPARIN SODIUM 40 MG/0.4ML IJ SOSY *I*
40.0000 mg | PREFILLED_SYRINGE | Freq: Two times a day (BID) | INTRAMUSCULAR | Status: DC
Start: 2021-03-23 — End: 2021-03-24
  Administered 2021-03-23 – 2021-03-24 (×3): 40 mg via SUBCUTANEOUS
  Filled 2021-03-22 (×3): qty 0.4

## 2021-03-22 MED ORDER — INSULIN LISPRO (HUMAN) 100 UNIT/ML IJ/SC SOLN *WRAPPED*
0.0000 [IU] | Freq: Three times a day (TID) | SUBCUTANEOUS | Status: DC
Start: 2021-03-22 — End: 2021-03-23
  Administered 2021-03-22 (×2): 6 [IU] via SUBCUTANEOUS

## 2021-03-22 MED ORDER — LIDOCAINE HCL 2 % EX JELLY WRAPPED *I*
Freq: Once | CUTANEOUS | Status: AC
Start: 2021-03-22 — End: 2021-03-22

## 2021-03-22 NOTE — Progress Notes (Signed)
Physical Therapy Evaluation      Discharge recommendation:   Home without PT intervention  Equipment recommendations upon discharge:  NA  Mobility recommendations for nursing while in hospital:  Independent at will    Patient Name: Craig Gilbert  Date of Birth: 1956/04/02  MRN#: [id number]  Admission Date: 03/20/2021  Date of Service: 03/22/2021    History of present illness:   Craig Gilbert is a 65 y.o. male who was admitted to [facility] for a left hip abscess.    Prior physical therapy history for above complaint within the last year: NA    Admitting Diagnosis:   Abscess of the left hip       Past Medical History:   Diagnosis Date    Anxiety     Arthritis     DM (diabetes mellitus)     High cholesterol     HTN (hypertension)     Hydronephrosis     Nerve pain     PE (pulmonary thromboembolism)     Restless leg syndrome     Sciatica     Urethral stricture     Urinary retention      Past Surgical History:   Procedure Laterality Date    PR CYSTOURETHROSCOPY N/A 01/07/2021    Procedure: urethroscopy;  Surgeon: Lowella Dandy, MD;  Location: [facility] MAIN OR;  Service: Urology    PR CYSTOURETHROSCOPY N/A 03/14/2021    Procedure: CYSTOSCOPY with  internal uretheromy;  Surgeon: Lowella Dandy, MD;  Location: [facility] MAIN OR;  Service: Urology    TONSILLECTOMY AND [place] Hospital Course:  EKG 12 lead    Result Date: 03/21/2021  Sinus rhythm Borderline left axis deviation    CT abdomen and pelvis with IV contrast    Result Date: 03/20/2021  Bladder mural thickening and mild perivesicular stranding, suspicious for cystitis. Recommend correlation with urinalysis. Interval decrease in extent of now mild to moderate left hydroureteronephrosis with subtle inflammatory changes adjacent to the left UPJ. This could also represent sequela of urinary tract infection. Interval development of multiloculated fluid collection adjacent to the proximal left femur/lesser trochanter, likely representing iliopsoas bursitis no  additional etiologies including synovial inflammation are not excluded. Consider further evaluation with MRI on a nonemergent basis if clinically indicated. END OF IMPRESSION I have personally reviewed the images and the Resident's/Fellow's interpretation and agree with or edited the findings. [facility] submits this DICOM format image data and final report to the [facility], an independent secure electronic health information exchange, on a reciprocally searchable basis (with patient authorization) for a minimum of 12 months after exam date.    *Chest STANDARD single view    Result Date: 03/21/2021  Normal chest x-ray END OF IMPRESSION [facility] submits this DICOM format image data and final report to the [facility], an independent secure electronic health information exchange, on a reciprocally searchable basis (with patient authorization) for a minimum of 12 months after exam date.        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    OBJECTIVE/SOCIAL HISTORY    Height: 189.2 cm (6' 2.5")  Weight: 123.7 kg (272 lb 11.3 oz)     PT Adult Assessment - 03/22/21 0852        Prior Living     Prior Living Situation Obtained via chart     Lives With Alone     Type  of Conception     # Steps to [place] 3     # Rails to [place] 1     [place] in [facility]        Prior Function Level    Prior Function Level Reported by patient     Transfers Independent     Transfer Devices None     Walking Independent     Walking assistive devices used None     Stair negotiation Independent        PT Tracking    PT TRACKING PT Assigned        Precautions/Observations    Precautions used Yes     Isolation Precautions None     Was patient wearing a mask? No     PPE worn by Editor, commissioning     Fall Precautions General falls precautions        Pain Assessment    *Is the patient currently in pain? Yes     0-10 Scale 5     Pain Location Hip     Pain Orientation Left         LE Assessment    Assessment Focus --   refer to 6/17 PT flow sheet       LLE (degrees)    Overall ROM ROM WFL        RLE (degrees)    Overall ROM ROM WFL        LLE Strength    Overall Strength --   refer to 6/17 PT flow sheet       RLE Strength    Overall Strength --   refer to 6/17 PT flow sheet       Bed Mobility    Supine to Sit Independent        Transfers    Sit to Stand Independent     Stand to sit Independent        Mobility    Mobility Tested     Gait Pattern --   slight limp due to pain, slight increase BOS, lateral fl       Family/Caregiver Training`    Patient/Family/Caregiver training Yes     Patient training Discharge planning        Assessment    Brief Assessment Skilled acute PT is not indicated;Patient demonstrates adequate mobility skills to return home        Plan/Recommendation    PT Treatment Interventions No further PT interventions        Time Calculation    PT Untimed Codes 1     PT Total Treatment 1                               ASSESSMENT:  Craig Gilbert is a 65 y.o. male who has been admitted to the hospital with left hip abscess.  He has been referred to PT for Transfer Training, Personnel officer, Economist. Patient presents to PT currently ambulating without devices, transferring and performing bed mobility ind..  Patient does not require physical therapy intervention at this time.     Personal factors affecting treatment/recovery:   none identified    Comorbidities affecting treatment/recovery for this admission (*see bolded below):  Past Medical History:   Diagnosis Date    Anxiety     Arthritis     DM (diabetes mellitus)  High cholesterol     HTN (hypertension)     Hydronephrosis     Nerve pain     PE (pulmonary thromboembolism)     Restless leg syndrome     Sciatica     Urethral stricture     Urinary retention        Clinical presentation:   stable    Patient complexity:     low level as  indicated by above stability of condition, personal factors, environmental factors and comorbidities in addition to their impairments found on physical exam.    PT Prognosis:NA at this time    Patient / Caregiver Understanding: excellent    Long term goals: NA    PT Care Plan Notes     No notes found.            PLAN   (see flow sheet above for components of plan)    Plan of Care     The physician's co-signature on this note indicates that they have reviewed this evaluation and agree with the documented goals and plan of care.    Thank you for the referral.    Veleta Miners, PT

## 2021-03-22 NOTE — Continuity of Care (Signed)
Discussed discharge planning with Craig Gilbert, he reports he lives at home alone, he is normally independent with ADLs and Ambulation, still drives and currently is unsure of his discharge needs, Case management continues to follow.

## 2021-03-22 NOTE — Progress Notes (Signed)
Vancomycin Maintenance Regimen - Pharmacist to Dose    Patient is receiving Vancomycin 1750 mg Q12H for Bacteremia. Today is day 3 of therapy.    Goal vancomycin trough is 15-20 mcg/mL.    Laboratory Data      Lab results: 03/22/21  0430 03/21/21  0517 03/20/21  1456   WBC 9.2* 12.7* 15.9*         Lab results: 03/22/21  0430 03/21/21  1214 03/21/21  0517   Creatinine 0.76 0.66* 0.73         Lab results: 03/22/21  0430 03/21/21  1214 03/21/21  0517   UN 18 20 17      Vancomycin concentrations:       Lab results: 03/22/21  0430   Vancomycin Trough 9.7*       Assessment and Plan   Considering patient's renal function, volume status, clinical status, and reported concentrations, 1500 mg Q8H will obtain goal vancomycin concentration unless renal function or clinical status changes.    Next vancomycin concentration before the 4th or 5th dose, order placed to be drawn on 03/23/21 at 1300.    Pharmacist will follow for changes in renal function, toxicity, and efficacy and order serum concentrations and creatinine as needed.    Infectious diseases approval needs to be obtained for continued use of vancomycin.  Please contact the Infectious Diseases Antibiotic Approval Pager [phone]).      For questions contact pharmacy    Leda Quail, PharmD

## 2021-03-22 NOTE — Progress Notes (Signed)
Report received. Patient appears to be sleeping comfortably in bed. Easily awoken. Bladder scan done. >918m noted. Patient denies urge to void without pressure placed on bladder. Patient out of bed and ambulating in room independently. Patient noted to make several unsuccessful attempts to void. MD notified. Per MD insert foley catheter.   Patient noted to be fearful of foley catheter insertion. States previous foley was placed by urologist while patient was sedated. Patient concerns addressed. Lidocaine utilize. Foley catheter placed. Resistance met. Patient noted to report severe pain with foley catheter insertion. Foley catheter drained remains patent. 10546moutput noted. Patient requested a "hall pass" to leave to "take care of some things and then come back." Patient states he was not prepared for hospitalization at this time and has several concerns to address at home. 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 Patient able to call sister and request she care for the cat. Patient noted to continue to express concerns with his business.   Dr. YaRed Christianso unit. Update given, plan of care reviewed.   FSBS done. PT in to see patient.   Medications administered.   Patient to bathroom in room independently. Reports BM. Patient independent of hygiene.   Verbal report given to Brand RN.  20g PIV inserted. No complications noted.  Patient reports further stress induced by personal relationship with spouse. States he lives alone due to disagreements with spouse.   Patient ambulated to 299 and appears to rest comfortably in bed. Patient reports decrease in feeling overwhelmed. Denies further needs. Care transferred.

## 2021-03-22 NOTE — Progress Notes (Signed)
[place] MEDICINE PROGRESS NOTE    SUBJECTIVE  Patient seen and examined at bedside in the morning.  Patient was given pain medications overnight.  Repeat blood cultures negative for now, talk with orthopedics again, recommended repeat CT, will keep the patient n.p.o. in anticipation of surgery tomorrow, holding off on anticoagulation    VITAL SIGNS  BP 139/90 (BP Location: Left arm)    Pulse 84    Temp 37.5 C (99.5 F) (Oral)    Resp 18    Ht 1.892 m (6' 2.5")    Wt 123.7 kg (272 lb 11.3 oz)    SpO2 94%    BMI 34.55 kg/m     Intake/Output Summary (Last 24 hours) at 03/22/2021 1712  Last data filed at 03/22/2021 1459  Gross per 24 hour   Intake 935 ml   Output 1900 ml   Net -965 ml         PHYSICAL EXAM  Constitutional:   not in acute distress. Normal appearance.  Not  ill-appearing, toxic-appearing or diaphoretic.   HENT: Normocephalic and atraumatic.    Eyes: : Extraocular movements intact.  Pupils are equal, round, and reactive to light.   Cardiovascular:  Normal rate and regular rhythm. Normal pulses.  Normal heart sounds.   Pulmonary: Pulmonary effort is normal.  Air movement present.   Abdominal: Abdomen is flat.  Abdomen is soft. There is no right CVA tenderness or left CVA tenderness.   Musculoskeletal:   Normal range of motion.  Left hip pain and tenderness, mostly on the lateral aspect of the left hip, limited range of motion secondary to pain  Skin:Skin is warm and dry.  Capillary refill takes less than 2 seconds.   Neurological: No focal deficit present. He is alert and oriented to person, place, and time. No cranial nerve deficit.   Psychiatric:    Mood normal, Behavior normal.      Lab Results:   Recent Labs   Lab 03/22/21  0430 03/21/21  0517 03/20/21  1456 03/19/21  2233   WBC 9.2* 12.7* 15.9* 14.1*   Hemoglobin 13.4* 14.3 15.7 16.7   Hematocrit 39* 41 44 47   Platelets 144* 157 174 199   Seg Neut %  --  84.4 88.7 87.8   Lymphocyte %  --  8.2 4.4 6.1   Monocyte %  --  6.3 6.1 4.9   Eosinophil %   --  0.1 0.0 0.7       Recent Labs   Lab 03/22/21  0430 03/21/21  1214 03/21/21  0517   Sodium 131* 133 134   Potassium 4.1 3.9 3.2*   CO2 _0 UN _1 Creatinine 0.76 0.66* 0.73   Glucose 235* 235* 225*   Calcium 8.1* 8.4* 8.4*       Recent Labs   Lab 03/20/21  1456   INR 1.3*   aPTT 25.9   Protime 15.2*     No components found with this basename: APTT      Recent Labs   Lab 03/21/21  1214 03/21/21  0517 03/19/21  2233   Alk Phos 103 112 145*   Bilirubin,Total 0.4 0.5 0.7   Albumin 3.1* 3.0* 4.2   ALT _2 AST _3 Total Protein 6.3 6.6 8.1*       Glucose       Lab results: 03/22/21  1612 03/22/21  1121 03/22/21  0865 03/22/21  0430 03/21/21  2047 03/21/21  1706 03/21/21  1214 03/21/21  0805 03/21/21  0517 03/20/21  1456 03/19/21  2233   Glucose  --   --   --  235*  --   --  235*  --  225* 358* 285*   Glucose POCT 220* 194* 243*  --  188* 162*  --    < >  --   --   --     < > = values in this interval not displayed.       Lab Results   Component Value Date    HA1C 10.4 (H) 11/19/2020                                             Imaging/Diagnostics (last 3 days)     EKG 12 lead    Result Date: 03/21/2021  Sinus rhythm Borderline left axis deviation    CT abdomen and pelvis with IV contrast    Result Date: 03/20/2021  Bladder mural thickening and mild perivesicular stranding, suspicious for cystitis. Recommend correlation with urinalysis. Interval decrease in extent of now mild to moderate left hydroureteronephrosis with subtle inflammatory changes adjacent to the left UPJ. This could also represent sequela of urinary tract infection. Interval development of multiloculated fluid collection adjacent to the proximal left femur/lesser trochanter, likely representing iliopsoas bursitis no additional etiologies including synovial inflammation are not excluded. Consider further evaluation with MRI on a nonemergent basis if clinically indicated. END OF IMPRESSION I have personally reviewed the images and  the Resident's/Fellow's interpretation and agree with or edited the findings. [facility] submits this DICOM format image data and final report to the [facility], an independent secure electronic health information exchange, on a reciprocally searchable basis (with patient authorization) for a minimum of 12 months after exam date.    *Chest STANDARD single view    Result Date: 03/21/2021  Normal chest x-ray END OF IMPRESSION [facility] submits this DICOM format image data and final report to the [facility], an independent secure electronic health information exchange, on a reciprocally searchable basis (with patient authorization) for a minimum of 12 months after exam date.            [facility] Problems    Diagnosis     Leg abscess          CURRENT MEDICATIONS    SCHEDULED MEDS:   vancomycin  1,500 mg Intravenous Q8H    tamsulosin  0.8 mg Oral QPM    insulin lispro  0-9 units Subcutaneous TID WC    insulin glargine  10 units Subcutaneous Nightly    ceFEPime (MAXIPIME) IV  2,000 mg Intravenous Q8H    pantoprazole  40 mg Oral QAM    atorvastatin  10 mg Oral Daily    FLUoxetine  10 mg Oral Daily    gabapentin  300 mg Oral Nightly    lisinopril  5 mg Oral Daily    pramipexole  1.5 mg Oral Nightly    clonazePAM  1 mg Oral Nightly       CONTINUOUS INFUSIONS:   Vancomycin - Pharmacist to Dose         PRN MEDS:.acetaminophen, HYDROcodone-acetaminophen, HYDROcodone-acetaminophen, promethazine, ondansetron, Nursing communication- Give 4 OZ of fruit juice for BG < 70 mg/dl **AND** dextrose **AND** dextrose **AND** glucagon **AND**  POCT glucose         ASSESSMENT AND PLAN    Sepsis secondary to abscess at left femur(tachypnea, leukocytosis, source of infection present)  Pseudomonas bacteremia/septicemia  -Continue with broad-spectrum antibiotics, Vanco and cefepime  -Orthopedic consulted, recommends conservative management for now  -Follow repeat blood cultures, initial blood  cultures growing Pseudomonas, will follow sensitivities  -Follow catheter tip cultures  -Order repeat blood cultures if patient has fever  -Repeat CT pelvis and femur ordered with IV contrast to better look at abscess/fluid collection noted on initial imaging     Anxiety              Continue home dose of clonazepam 1 mg po daily    Diabetes Mellitus              Accu-Cheks AC at bedtime, basal bolus regimen, nighttime dose increased    HTN                            Continue home dose of Lisinopril 5 mg po daily    Hyperlipidemia              Continue home dose of Atorvastatin 10 mg po q hS    Indwelling Urinary Catheter s/p cystoscopy  Urinary retention  -Foley reinserted, follow-up with urology as an outpatient    Pulmonary Embolism-on anticoagulation with apixaban  -We will hold off on Lovenox for now given anticipated surgery in the morning    Restless Leg syndrome              Continue home dose of Pramipexole 1.5 mg po q HS    DVT prophylaxis: Lovenox      Code Status:   Code Status Information     Code Status Advance Care Planning    Full Code Jump to the Activity           DISPOSITION:Home      AUTHOR: Wray Kearns, MBBS

## 2021-03-22 NOTE — Progress Notes (Signed)
Assumed care of patient at this time. Report from [facility] in ICU. Transferred to M/S room at this time.    Patient assessment complete see intervention. IV ABX infusing currently. Patient oriented to room, resting comfortably. Patient denies any needs in this time.

## 2021-03-23 ENCOUNTER — Inpatient Hospital Stay: Payer: Medicaid Other

## 2021-03-23 ENCOUNTER — Inpatient Hospital Stay: Admit: 2021-03-23 | Discharge: 2021-03-23 | Disposition: A | Discharge status: Home / Self Care | Payer: Self-pay

## 2021-03-23 DIAGNOSIS — M76891 Other specified enthesopathies of right lower limb, excluding foot: Secondary | ICD-10-CM

## 2021-03-23 DIAGNOSIS — M11261 Other chondrocalcinosis, right knee: Secondary | ICD-10-CM

## 2021-03-23 LAB — POCT GLUCOSE
Glucose POCT: 274 mg/dL — ABNORMAL HIGH (ref 60–99)
Glucose POCT: 144 mg/dL — ABNORMAL HIGH (ref 60–99)
Glucose POCT: 189 mg/dL — ABNORMAL HIGH (ref 60–99)
Glucose POCT: 154 mg/dL — ABNORMAL HIGH (ref 60–99)

## 2021-03-23 LAB — CBC
Hematocrit: 37 % — ABNORMAL LOW (ref 40–51)
Hemoglobin: 13.1 g/dL — ABNORMAL LOW (ref 13.7–17.5)
MCH: 31 pg (ref 26–32)
MCHC: 35 g/dL (ref 32–37)
MCV: 88 fL (ref 79–92)
Platelets: 153 10*3/uL (ref 150–330)
RBC: 4.2 MIL/uL — ABNORMAL LOW (ref 4.6–6.1)
RDW: 11.9 % (ref 11.6–14.4)
WBC: 5.9 10*3/uL (ref 4.2–9.1)

## 2021-03-23 LAB — BASIC METABOLIC PANEL
Anion Gap: 9 (ref 7–16)
CO2: 22 mmol/L (ref 20–28)
Calcium: 8.1 mg/dL — ABNORMAL LOW (ref 8.6–10.2)
Chloride: 101 mmol/L (ref 96–108)
Creatinine: 0.76 mg/dL (ref 0.67–1.17)
Glucose: 316 mg/dL — ABNORMAL HIGH (ref 60–99)
Lab: 14 mg/dL (ref 6–20)
Potassium: 3.6 mmol/L (ref 3.4–4.7)
Sodium: 132 mmol/L — ABNORMAL LOW (ref 133–145)
eGFR BY CREAT: 101 *

## 2021-03-23 LAB — CRP: CRP: 70 mg/L — ABNORMAL HIGH (ref 0–8)

## 2021-03-23 LAB — PROCALCITONIN: Procalcitonin: 0.37 ng/mL — ABNORMAL HIGH (ref 0.00–0.08)

## 2021-03-23 LAB — COVID-19 NAAT (PCR): COVID-19 NAAT (PCR): NEGATIVE

## 2021-03-23 LAB — VANCOMYCIN, TROUGH: Vancomycin Trough: 18.5 ug/mL (ref 10.0–20.0)

## 2021-03-23 MED ORDER — INSULIN LISPRO (HUMAN) 100 UNIT/ML IJ/SC SOLN *WRAPPED*
0.0000 [IU] | Freq: Three times a day (TID) | SUBCUTANEOUS | Status: DC
Start: 2021-03-23 — End: 2021-03-26
  Administered 2021-03-23 (×2): 9 [IU] via SUBCUTANEOUS
  Administered 2021-03-24: 17 [IU] via SUBCUTANEOUS
  Administered 2021-03-25: 12 [IU] via SUBCUTANEOUS
  Administered 2021-03-25: 13 [IU] via SUBCUTANEOUS

## 2021-03-23 MED ORDER — FLUCONAZOLE 200 MG IN 100 ML NACL IV SOLN *I*
200.0000 mg | INTRAVENOUS | Status: DC
Start: 2021-03-23 — End: 2021-03-23

## 2021-03-23 MED ORDER — INSULIN LISPRO (HUMAN) 100 UNIT/ML IJ/SC SOLN *WRAPPED*
0.0000 [IU] | Freq: Three times a day (TID) | SUBCUTANEOUS | Status: DC
Start: 2021-03-23 — End: 2021-03-23
  Administered 2021-03-23: 9 [IU] via SUBCUTANEOUS
  Filled 2021-03-23: qty 0.01

## 2021-03-23 MED ORDER — INSULIN GLARGINE 100 UNIT/ML SC SOLN *WRAPPED*
15.0000 [IU] | Freq: Every evening | SUBCUTANEOUS | Status: DC
Start: 2021-03-23 — End: 2021-03-23

## 2021-03-23 MED ORDER — FLUCONAZOLE 200 MG IN 100 ML NACL IV SOLN *I*
200.0000 mg | INTRAVENOUS | Status: DC
Start: 2021-03-23 — End: 2021-03-26
  Administered 2021-03-23 – 2021-03-25 (×3): 200 mg via INTRAVENOUS
  Filled 2021-03-23 (×3): qty 100

## 2021-03-23 MED ORDER — INSULIN GLARGINE 100 UNIT/ML SC SOLN *WRAPPED*
25.0000 [IU] | Freq: Every evening | SUBCUTANEOUS | Status: DC
Start: 2021-03-23 — End: 2021-03-24
  Administered 2021-03-23: 25 [IU] via SUBCUTANEOUS
  Filled 2021-03-23: qty 0.25

## 2021-03-23 NOTE — Progress Notes (Signed)
[facility] MEDICINE PROGRESS NOTE    SUBJECTIVE  Patient seen and examined at bedside in the morning.  Patient was given pain medications overnight.  Catheter tip also growing Pseudomonas repeat blood cultures negative for now, talk with orthopedics again, CT findings discussed with orthopedics, MRI ordered for tomorrow, will get x-ray of the right knee given patient has reported history of pellets, patient has tolerated MRI in the past at [facility].    VITAL SIGNS  BP 120/89    Pulse 69    Temp 37.6 °C (99.7 °F)    Resp 18    Ht 1.892 m (6' 2.5")    Wt 123.7 kg (272 lb 11.3 oz)    SpO2 95%    BMI 34.55 kg/m²     Intake/Output Summary (Last 24 hours) at 03/23/2021 1902  Last data filed at 03/23/2021 0509  Gross per 24 hour   Intake —   Output 500 ml   Net -500 ml         PHYSICAL EXAM  Constitutional:   not in acute distress. Normal appearance.  Not  ill-appearing, toxic-appearing or diaphoretic.   HENT: Normocephalic and atraumatic.    Eyes: : Extraocular movements intact.  Pupils are equal, round, and reactive to light.   Cardiovascular:  Normal rate and regular rhythm. Normal pulses.  Normal heart sounds.   Pulmonary: Pulmonary effort is normal.  Air movement present.   Abdominal: Abdomen is flat.  Abdomen is soft. There is no right CVA tenderness or left CVA tenderness.   Musculoskeletal:   Normal range of motion.  Left hip pain and tenderness, mostly on the lateral aspect of the left hip, limited range of motion secondary to pain  Skin:Skin is warm and dry.  Capillary refill takes less than 2 seconds.   Neurological: No focal deficit present. He is alert and oriented to person, place, and time. No cranial nerve deficit.   Psychiatric:    Mood normal, Behavior normal.      Lab Results:   Recent Labs   Lab 03/23/21  0553 03/22/21  0430 03/21/21  0517 03/20/21  1456 03/19/21  2233   WBC 5.9 9.2* 12.7* 15.9* 14.1*   Hemoglobin 13.1* 13.4* 14.3 15.7 16.7   Hematocrit 37* 39* 41 44 47   Platelets 153 144*  157 174 199   Seg Neut %  --   --  84.4 88.7 87.8   Lymphocyte %  --   --  8.2 4.4 6.1   Monocyte %  --   --  6.3 6.1 4.9   Eosinophil %  --   --  0.1 0.0 0.7       Recent Labs   Lab 03/23/21  0553 03/22/21  0430 03/21/21  1214   Sodium 132* 131* 133   Potassium 3.6 4.1 3.9   CO2 22 22 22   UN 14 18 20   Creatinine 0.76 0.76 0.66*   Glucose 316* 235* 235*   Calcium 8.1* 8.1* 8.4*       Recent Labs   Lab 03/20/21  1456   INR 1.3*   aPTT 25.9   Protime 15.2*     No components found with this basename: APTT      Recent Labs   Lab 03/21/21  1214 03/21/21  0517 03/19/21  2233   Alk Phos 103 112 145*   Bilirubin,Total 0.4 0.5 0.7   Albumin 3.1* 3.0* 4.2   ALT 10 11 16     AST 9 11 13   Total Protein 6.3 6.6 8.1*       Glucose       Lab results: 03/23/21  1639 03/23/21  1212 03/23/21  0553 03/23/21  0514 03/22/21  2249 03/22/21  1612 03/22/21  0841 03/22/21  0430 03/21/21  1706 03/21/21  1214 03/21/21  0805 03/21/21  0517 03/20/21  1456   Glucose  --   --  316*  --   --   --   --  235*  --  235*  --  225* 358*   Glucose POCT 189* 144*  --  274* 229* 220*   < >  --    < >  --    < >  --   --     < > = values in this interval not displayed.       Lab Results   Component Value Date    HA1C 10.4 (H) 11/19/2020                                             Imaging/Diagnostics (last 3 days)     EKG 12 lead    Result Date: 03/21/2021  Sinus rhythm Borderline left axis deviation    CT pelvis with contrast    Result Date: 03/22/2021  Multiloculated cystic lesion centered around the LEFT hip, suspicious for a large multiloculated abscess. There is also edema in the LEFT femoral neck/intertrochanteric, suspicious for osteomyelitis. Consider fluid sampling. A bland bursitis is less likely given the overall appearance and rapid development over several months. Foley catheter in empty bladder. Perivesical edema, possible cystitis. New low-attenuation area in the prostate. Together these findings suggest a urinary infection which may have  spread to the bursa/soft tissues around the hip. Discussed with covering provider at about 8:00 PM. END IMPRESSION I have personally reviewed the images and the Resident's/Fellow's interpretation and agree with or edited the findings. [facility] submits this DICOM format image data and final report to the [facility], an independent secure electronic health information exchange, on a reciprocally searchable basis (with patient authorization) for a minimum of 12 months after exam date.    *Chest STANDARD single view    Result Date: 03/21/2021  Normal chest x-ray END OF IMPRESSION [facility] submits this DICOM format image data and final report to the [facility], an independent secure electronic health information exchange, on a reciprocally searchable basis (with patient authorization) for a minimum of 12 months after exam date.            HOSPITAL PROBLEMS  [facility] Problems    Diagnosis    • Leg abscess          CURRENT MEDICATIONS    SCHEDULED MEDS:  • insulin glargine  25 units Subcutaneous Nightly   • insulin lispro  0-9 units Subcutaneous TID WC   • vancomycin  1,500 mg Intravenous Q8H   • tamsulosin  0.8 mg Oral QPM   • enoxaparin  40 mg Subcutaneous 2 times per day   • ceFEPime (MAXIPIME) IV  2,000 mg Intravenous Q8H   • pantoprazole  40 mg Oral QAM   • atorvastatin  10 mg Oral Daily   • FLUoxetine  10 mg Oral Daily   • gabapentin  300 mg Oral Nightly   • lisinopril  5 mg Oral Daily   •   pramipexole  1.5 mg Oral Nightly       CONTINUOUS INFUSIONS:   Vancomycin - Pharmacist to Dose         PRN MEDS:.acetaminophen, HYDROcodone-acetaminophen, HYDROcodone-acetaminophen, promethazine, ondansetron, Nursing communication- Give 4 OZ of fruit juice for BG < 70 mg/dl **AND** dextrose **AND** dextrose **AND** glucagon **AND** POCT glucose         ASSESSMENT AND PLAN    Sepsis secondary to abscess at left femur(tachypnea, leukocytosis, source of infection present)  Pseudomonas bacteremia/septicemia likely  source UTI and abscess  Concern for osteomyelitis of the left femoral neck    -Continue with broad-spectrum antibiotics, Vanco and cefepime-day 3  -Orthopedic consulted, awaiting MRI of the left hip with and without contrast along with MRI pelvis  -Follow repeat blood cultures, initial blood cultures growing Pseudomonas, sensitive to cefepime  -Follow catheter tip cultures which are growing Candida and Pseudomonas, catheter exchanged  -Order repeat blood cultures if patient has fever  -Repeat CT pelvis and femur  with IV contrast again demonstrated multiloculated fluid collection along with concern for DVT and left femoral neck osteomyelitis.     Anxiety              Continue home dose of clonazepam 1 mg po daily    Diabetes Mellitus              Accu-Cheks AC at bedtime, basal bolus regimen, nighttime dose increased    HTN                            Continue home dose of Lisinopril 5 mg po daily    Hyperlipidemia              Continue home dose of Atorvastatin 10 mg po q hS    Indwelling Urinary Catheter s/p cystoscopy  Urinary retention  -Foley reinserted, follow-up with urology as an outpatient, catheter tip showing Candida and Pseudomonas,    Pulmonary Embolism-on anticoagulation with apixaban-lower  Patient is on low-dose apixaban??  -Continue with Lovenox in anticipation for surgery    Restless Leg syndrome              Continue home dose of Pramipexole 1.5 mg po q HS    DVT prophylaxis: Lovenox      Code Status:   Code Status Information     Code Status Advance Care Planning    Full Code Jump to the Activity           DISPOSITION:Home      AUTHOR: Wray Kearns, MBBS

## 2021-03-23 NOTE — Progress Notes (Signed)
Vancomycin Maintenance Regimen - Pharmacist to Dose    Patient is receiving Vancomycin 1500 mg Q8H for Bacteremia. Today is day 4 of therapy.    Goal vancomycin trough is 15-20 mcg/mL.    Laboratory Data      Lab results: 03/23/21  0553 03/22/21  0430 03/21/21  0517   WBC 5.9 9.2* 12.7*         Lab results: 03/23/21  0553 03/22/21  0430 03/21/21  1214   Creatinine 0.76 0.76 0.66*         Lab results: 03/23/21  0553 03/22/21  0430 03/21/21  1214   UN 14 18 20      Vancomycin concentrations:       Lab results: 03/23/21  1300   Vancomycin Trough 18.5       Assessment and Plan   Considering patient's renal function, volume status, clinical status, and reported concentrations,   mg   will obtain goal vancomycin concentration unless renal function or clinical status changes.    Next vancomycin concentration in 24 hours, order placed to be drawn on 03/24/21 at 1300.    Pharmacist will follow for changes in renal function, toxicity, and efficacy and order serum concentrations and creatinine as needed.    Antimicrobial Stewardship Program has approved for 7 days    For questions contact pharmacy at [place], PharmD

## 2021-03-24 ENCOUNTER — Inpatient Hospital Stay: Payer: Medicaid Other

## 2021-03-24 ENCOUNTER — Inpatient Hospital Stay: Admit: 2021-03-24 | Discharge: 2021-03-24 | Disposition: A | Discharge status: Home / Self Care | Payer: Self-pay

## 2021-03-24 DIAGNOSIS — R2242 Localized swelling, mass and lump, left lower limb: Secondary | ICD-10-CM

## 2021-03-24 DIAGNOSIS — I82412 Acute embolism and thrombosis of left femoral vein: Secondary | ICD-10-CM

## 2021-03-24 LAB — BASIC METABOLIC PANEL
Anion Gap: 10 (ref 7–16)
CO2: 24 mmol/L (ref 20–28)
Calcium: 8.3 mg/dL — ABNORMAL LOW (ref 8.6–10.2)
Chloride: 99 mmol/L (ref 96–108)
Creatinine: 0.75 mg/dL (ref 0.67–1.17)
Glucose: 153 mg/dL — ABNORMAL HIGH (ref 60–99)
Lab: 11 mg/dL (ref 6–20)
Potassium: 3.3 mmol/L — ABNORMAL LOW (ref 3.4–4.7)
Sodium: 133 mmol/L (ref 133–145)
eGFR BY CREAT: 101 *

## 2021-03-24 LAB — CBC
Hematocrit: 36 % — ABNORMAL LOW (ref 40–51)
Hemoglobin: 12.8 g/dL — ABNORMAL LOW (ref 13.7–17.5)
MCH: 31 pg (ref 26–32)
MCHC: 35 g/dL (ref 32–37)
MCV: 86 fL (ref 79–92)
Platelets: 170 10*3/uL (ref 150–330)
RBC: 4.2 MIL/uL — ABNORMAL LOW (ref 4.6–6.1)
RDW: 11.9 % (ref 11.6–14.4)
WBC: 6 10*3/uL (ref 4.2–9.1)

## 2021-03-24 LAB — CRP: CRP: 41 mg/L — ABNORMAL HIGH (ref 0–8)

## 2021-03-24 LAB — PROCALCITONIN: Procalcitonin: 0.25 ng/mL — ABNORMAL HIGH (ref 0.00–0.08)

## 2021-03-24 LAB — VANCOMYCIN, TROUGH: Vancomycin Trough: 19.1 ug/mL (ref 10.0–20.0)

## 2021-03-24 LAB — POCT GLUCOSE
Glucose POCT: 132 mg/dL — ABNORMAL HIGH (ref 60–99)
Glucose POCT: 311 mg/dL — ABNORMAL HIGH (ref 60–99)
Glucose POCT: 127 mg/dL — ABNORMAL HIGH (ref 60–99)
Glucose POCT: 253 mg/dL — ABNORMAL HIGH (ref 60–99)

## 2021-03-24 MED ORDER — POTASSIUM CHLORIDE CRYS CR 20 MEQ PO TBCR *I*
40.0000 meq | ORAL_TABLET | Freq: Once | ORAL | Status: AC
Start: 2021-03-24 — End: 2021-03-24
  Administered 2021-03-24: 40 meq via ORAL
  Filled 2021-03-24: qty 2

## 2021-03-24 MED ORDER — INSULIN GLARGINE 100 UNIT/ML SC SOLN *WRAPPED*
20.0000 [IU] | Freq: Every evening | SUBCUTANEOUS | Status: DC
Start: 2021-03-24 — End: 2021-03-26
  Administered 2021-03-24 – 2021-03-25 (×2): 20 [IU] via SUBCUTANEOUS
  Filled 2021-03-24 (×2): qty 0.2

## 2021-03-24 MED ORDER — HYDROCODONE-ACETAMINOPHEN 5-325 MG PO TABS *I*
1.0000 | ORAL_TABLET | ORAL | Status: DC | PRN
Start: 2021-03-24 — End: 2021-03-26
  Filled 2021-03-24 (×2): qty 1

## 2021-03-24 MED ORDER — HYDROCODONE-ACETAMINOPHEN 5-325 MG PO TABS *I*
2.0000 | ORAL_TABLET | ORAL | Status: DC | PRN
Start: 2021-03-24 — End: 2021-03-26
  Administered 2021-03-24 – 2021-03-25 (×4): 2 via ORAL
  Filled 2021-03-24 (×4): qty 2

## 2021-03-24 MED ORDER — GADOTERIDOL 279.3 MG/ML (PROHANCE) IV SOLN *I*
20.0000 mL | Freq: Once | INTRAVENOUS | Status: AC
Start: 2021-03-24 — End: 2021-03-24
  Administered 2021-03-24: 20 mL via INTRAVENOUS

## 2021-03-24 NOTE — Progress Notes (Signed)
[facility] PROGRESS NOTE    SUBJECTIVE  Patient seen and examined at bedside in the morning.  Patient was given pain medications overnight.  Norco frequency increased, MRI reviewed, patient will be made n.p.o. for debridement and biopsy tomorrow.  Catheter tip also growing Pseudomonas repeat blood cultures, lower extremity Doppler showing DVT, will hold anticoagulation in anticipation for surgery tomorrow, Eliquis dose needs to be increased on discharge.    VITAL SIGNS  BP (!) 135/93    Pulse 72    Temp 37.1 C (98.8 F)    Resp 18    Ht 1.892 m (6' 2.5")    Wt 123.7 kg (272 lb 11.3 oz)    SpO2 95%    BMI 34.55 kg/m     Intake/Output Summary (Last 24 hours) at 03/24/2021 1924  Last data filed at 03/24/2021 0559  Gross per 24 hour   Intake 650 ml   Output 500 ml   Net 150 ml         PHYSICAL EXAM  Constitutional:   not in acute distress. Normal appearance.  Not  ill-appearing, toxic-appearing or diaphoretic.   HENT: Normocephalic and atraumatic.    Eyes: : Extraocular movements intact.  Pupils are equal, round, and reactive to light.   Cardiovascular:  Normal rate and regular rhythm. Normal pulses.  Normal heart sounds.   Pulmonary: Pulmonary effort is normal.  Air movement present.   Abdominal: Abdomen is flat.  Abdomen is soft. There is no right CVA tenderness or left CVA tenderness.   Musculoskeletal:   Normal range of motion.  Left hip pain and tenderness, mostly on the lateral aspect of the left hip, inguinal lymphadenopathy limited range of motion secondary to pain  Skin:Skin is warm and dry.  Capillary refill takes less than 2 seconds.   Neurological: No focal deficit present. He is alert and oriented to person, place, and time. No cranial nerve deficit.   Psychiatric:    Mood normal, Behavior normal.      Lab Results:   Recent Labs   Lab 03/24/21  0556 03/23/21  0553 03/22/21  0430 03/21/21  0517 03/20/21  1456 03/19/21  2233   WBC 6.0 5.9 9.2* 12.7* 15.9* 14.1*   Hemoglobin 12.8* 13.1* 13.4* 14.3  15.7 16.7   Hematocrit 36* 37* 39* 41 44 47   Platelets 170 153 144* 157 174 199   Seg Neut %  --   --   --  84.4 88.7 87.8   Lymphocyte %  --   --   --  8.2 4.4 6.1   Monocyte %  --   --   --  6.3 6.1 4.9   Eosinophil %  --   --   --  0.1 0.0 0.7       Recent Labs   Lab 03/24/21  0556 03/23/21  0553 03/22/21  0430   Sodium 133 132* 131*   Potassium 3.3* 3.6 4.1   CO2 24 22 22    UN 11 14 18    Creatinine 0.75 0.76 0.76   Glucose 153* 316* 235*   Calcium 8.3* 8.1* 8.1*       Recent Labs   Lab 03/20/21  1456   INR 1.3*   aPTT 25.9   Protime 15.2*     No components found with this basename: APTT      Recent Labs   Lab 03/21/21  1214 03/21/21  0517 03/19/21  2233   Alk Phos 103 112 145*  Bilirubin,Total 0.4 0.5 0.7   Albumin 3.1* 3.0* 4.2   ALT 10 11 16    AST 9 11 13    Total Protein 6.3 6.6 8.1*       Glucose       Lab results: 03/24/21  1202 03/24/21  0556 03/24/21  0517 03/23/21  2101 03/23/21  1639 03/23/21  1212 03/23/21  0553 03/22/21  0841 03/22/21  0430 03/21/21  1706 03/21/21  1214 03/21/21  0805 03/21/21  0517   Glucose  --  153*  --   --   --   --  316*  --  235*  --  235*  --  225*   Glucose POCT 311*  --  132* 154* 189* 144*  --    < >  --    < >  --    < >  --     < > = values in this interval not displayed.       Lab Results   Component Value Date    HA1C 10.4 (H) 11/19/2020                                             Imaging/Diagnostics (last 3 days)     MR femur/thigh LEFT without and with contrast    Result Date: 03/24/2021  Normal. END OF IMPRESSION [facility] submits this DICOM format image data and final report to the [facility], an independent secure electronic health information exchange, on a reciprocally searchable basis (with patient authorization) for a minimum of 12 months after exam date.    CT pelvis with contrast    Result Date: 03/22/2021  Multiloculated cystic lesion centered around the LEFT hip, suspicious for a large multiloculated abscess. There is also edema in the LEFT femoral  neck/intertrochanteric, suspicious for osteomyelitis. Consider fluid sampling. A bland bursitis is less likely given the overall appearance and rapid development over several months. Foley catheter in empty bladder. Perivesical edema, possible cystitis. New low-attenuation area in the prostate. Together these findings suggest a urinary infection which may have spread to the bursa/soft tissues around the hip. Discussed with covering provider at about 8:00 PM. END IMPRESSION I have personally reviewed the images and the Resident's/Fellow's interpretation and agree with or edited the findings. [facility] submits this DICOM format image data and final report to the [facility], an independent secure electronic health information exchange, on a reciprocally searchable basis (with patient authorization) for a minimum of 12 months after exam date.    US doppler vein bilateral lower extremities    Result Date: 03/24/2021  No deep vein thrombosis in the right lower extremity veins. However, partial, nonocclusive DVT in the left common femoral vein. Results were communicated to Dr. Red Christians at 8:25 AM on 03/24/2021. END OF IMPRESSION [facility] submits this DICOM format image data and final report to the [facility], an independent secure electronic health information exchange, on a reciprocally searchable basis (with patient authorization) for a minimum of 12 months after exam date.            [facility] Problems    Diagnosis     Leg abscess          CURRENT MEDICATIONS    SCHEDULED MEDS:   insulin glargine  20 units Subcutaneous Nightly    insulin lispro  0-9 units  Subcutaneous TID WC    fluconazole  200 mg Intravenous Q24H    vancomycin  1,500 mg Intravenous Q8H    tamsulosin  0.8 mg Oral QPM    ceFEPime (MAXIPIME) IV  2,000 mg Intravenous Q8H    pantoprazole  40 mg Oral QAM    atorvastatin  10 mg Oral Daily    FLUoxetine  10 mg Oral Daily    gabapentin  300 mg Oral Nightly     lisinopril  5 mg Oral Daily    pramipexole  1.5 mg Oral Nightly       CONTINUOUS INFUSIONS:   Vancomycin - Pharmacist to Dose         PRN MEDS:.HYDROcodone-acetaminophen, HYDROcodone-acetaminophen, acetaminophen, promethazine, ondansetron, Nursing communication- Give 4 OZ of fruit juice for BG < 70 mg/dl **AND** dextrose **AND** dextrose **AND** glucagon **AND** POCT glucose         ASSESSMENT AND PLAN    Sepsis secondary to abscess at left femur(tachypnea, leukocytosis, source of infection present)  Pseudomonas bacteremia/septicemia likely source UTI and abscess  Concern for osteomyelitis of the left femoral neck    -Continue with broad-spectrum antibiotics, Vanco and cefepime-day 4, fluconazole day 2  -Orthopedic consulted, reviewed MRI of the left hip with and without contrast along with MRI pelvis, discussed the findings with orthopedic and plan to do debridement along with biopsy tomorrow.  Patient will likely need PICC line and infectious disease consult after surgical procedure.  -Follow repeat blood cultures, initial blood cultures growing Pseudomonas, sensitive to cefepime  -Follow catheter tip cultures which are growing Candida and Pseudomonas, catheter exchanged, continue with fluconazole  -Order repeat blood cultures if patient has fever       Anxiety              Continue home dose of clonazepam 1 mg po daily    Diabetes Mellitus              Accu-Cheks AC at bedtime, basal bolus regimen, nighttime dose increased    HTN                            Continue home dose of Lisinopril 5 mg po daily    Hyperlipidemia              Continue home dose of Atorvastatin 10 mg po q hS    Indwelling Urinary Catheter s/p cystoscopy  Urinary retention  -Foley reinserted, follow-up with urology as an outpatient, catheter tip showing Candida and Pseudomonas,    Pulmonary Embolism-on anticoagulation with apixaban  Patient is on low-dose apixaban??  Partial, nonocclusive DVT in the left common femoral vein  -Hold  Lovenox in anticipation for surgery, resume after consultation with ortho  -Patient will need full dose apixaban on discharge    Restless Leg syndrome              Continue home dose of Pramipexole 1.5 mg po q HS    DVT prophylaxis: Chemoprophylaxis on hold given surgery tomorrow      Code Status:   Code Status Information     Code Status Advance Care Planning    Full Code Jump to the Activity           DISPOSITION:Home      AUTHOR: Wray Kearns, MBBS

## 2021-03-24 NOTE — Progress Notes (Signed)
Vancomycin Maintenance Regimen - Pharmacist to Dose    Patient is receiving Vancomycin 1500 mg Q8H for Bacteremia. Today is day 5 of therapy.    Goal vancomycin trough is 15-20 mcg/mL.    Laboratory Data      Lab results: 03/24/21  0556 03/23/21  0553 03/22/21  0430   WBC 6.0 5.9 9.2*         Lab results: 03/24/21  0556 03/23/21  0553 03/22/21  0430   Creatinine 0.75 0.76 0.76         Lab results: 03/24/21  0556 03/23/21  0553 03/22/21  0430   UN 11 14 18      Vancomycin concentrations:       Lab results: 03/24/21  1312   Vancomycin Trough 19.1       Assessment and Plan   Considering patient's renal function, volume status, clinical status, and reported concentrations,   mg   will obtain goal vancomycin concentration unless renal function or clinical status changes.    Next vancomycin concentration before the 4th or 5th dose, on 03/25/21 @ 1300.   Pharmacist will follow for changes in renal function, toxicity, and efficacy and order serum concentrations and creatinine as needed.        For questions contact pharmacy at [place].    Harriet Masson, PharmD

## 2021-03-25 ENCOUNTER — Encounter
Admission: EM | Disposition: A | Discharge status: Other Acute Inpatient Hospital | Payer: Self-pay | Source: Ambulatory Visit | Attending: Internal Medicine

## 2021-03-25 ENCOUNTER — Ambulatory Visit: Payer: Medicaid Other | Admitting: Urology

## 2021-03-25 ENCOUNTER — Encounter: Payer: Self-pay | Admitting: Certified Registered"

## 2021-03-25 LAB — CBC
Hematocrit: 37 % — ABNORMAL LOW (ref 40–51)
Hemoglobin: 13 g/dL — ABNORMAL LOW (ref 13.7–17.5)
MCH: 31 pg (ref 26–32)
MCHC: 35 g/dL (ref 32–37)
MCV: 87 fL (ref 79–92)
Platelets: 187 10*3/uL (ref 150–330)
RBC: 4.2 MIL/uL — ABNORMAL LOW (ref 4.6–6.1)
RDW: 11.9 % (ref 11.6–14.4)
WBC: 6.4 10*3/uL (ref 4.2–9.1)

## 2021-03-25 LAB — PERFORMING LAB

## 2021-03-25 LAB — BASIC METABOLIC PANEL
Anion Gap: 9 (ref 7–16)
CO2: 25 mmol/L (ref 20–28)
Calcium: 8.4 mg/dL — ABNORMAL LOW (ref 8.6–10.2)
Chloride: 100 mmol/L (ref 96–108)
Creatinine: 0.76 mg/dL (ref 0.67–1.17)
Glucose: 209 mg/dL — ABNORMAL HIGH (ref 60–99)
Lab: 9 mg/dL (ref 6–20)
Potassium: 3.5 mmol/L (ref 3.4–4.7)
Sodium: 134 mmol/L (ref 133–145)
eGFR BY CREAT: 101 *

## 2021-03-25 LAB — POCT GLUCOSE
Glucose POCT: 187 mg/dL — ABNORMAL HIGH (ref 60–99)
Glucose POCT: 177 mg/dL — ABNORMAL HIGH (ref 60–99)
Glucose POCT: 178 mg/dL — ABNORMAL HIGH (ref 60–99)
Glucose POCT: 159 mg/dL — ABNORMAL HIGH (ref 60–99)

## 2021-03-25 LAB — VANCOMYCIN, TROUGH: Vancomycin Trough: 19.9 ug/mL (ref 10.0–20.0)

## 2021-03-25 LAB — PROCALCITONIN: Procalcitonin: 0.15 ng/mL — ABNORMAL HIGH (ref 0.00–0.08)

## 2021-03-25 LAB — BLOOD CULTURE: Bacterial Blood Culture: 0

## 2021-03-25 LAB — COVID-19 NAAT (PCR): COVID-19 NAAT (PCR): NEGATIVE

## 2021-03-25 LAB — AEROBIC CULTURE

## 2021-03-25 LAB — CRP: CRP: 27 mg/L — ABNORMAL HIGH (ref 0–8)

## 2021-03-25 SURGERY — Surgical Case
Site: Hip | Laterality: Left

## 2021-03-25 MED ORDER — ENOXAPARIN SODIUM 120 MG/0.8ML IJ SOSY *I*
1.0000 mg/kg | PREFILLED_SYRINGE | Freq: Two times a day (BID) | INTRAMUSCULAR | Status: DC
Start: 2021-03-25 — End: 2021-03-26
  Administered 2021-03-25: 120 mg via SUBCUTANEOUS
  Filled 2021-03-25: qty 0.8

## 2021-03-25 MED ORDER — NICOTINE 21 MG/24HR TD PT24 *I*
1.0000 | MEDICATED_PATCH | Freq: Every day | TRANSDERMAL | Status: DC
Start: 2021-03-25 — End: 2021-03-26
  Administered 2021-03-25: 1 via TRANSDERMAL
  Filled 2021-03-25: qty 1

## 2021-03-25 NOTE — Progress Notes (Signed)
Nutrition Re-assessment and Follow Up    Admit Date: 03/20/2021  Reason for consult: Nutrition re-assessment and follow-up per high nutrition risk protocol. MST Score 0.  Malnutrition Screening Tool score or >/=2 due to unplanned weight loss and/or reported eating or drinking poorly or nausea, vomiting, diarrhea for 7 days.    Patient Summary: Per MD progress note 03/24/21, patient was made NPO for debridement and biopsyof left leg abscess; Patient has sepsis 2/2 abscess at left femur, UTI.         Past Medical History:   Diagnosis Date    Anxiety     Arthritis     DM (diabetes mellitus)     High cholesterol     HTN (hypertension)     Hydronephrosis     Nerve pain     PE (pulmonary thromboembolism)     Restless leg syndrome     Sciatica     Urethral stricture     Urinary retention            Past Surgical History:   Procedure Laterality Date    PR CYSTOURETHROSCOPY N/A 01/07/2021    Procedure: urethroscopy;  Surgeon: Lowella Dandy, MD;  Location: [facility] MAIN OR;  Service: Urology    PR CYSTOURETHROSCOPY N/A 03/14/2021    Procedure: CYSTOSCOPY with  internal uretheromy;  Surgeon: Lowella Dandy, MD;  Location: [facility] MAIN OR;  Service: Urology    TONSILLECTOMY AND ADENOIDECTOMY  1969        Pertinent Social Hx: Married; Current every day cigarette smoker, No current alcohol use, Current marijuana use.     Pertinent Meds: Reviewed   Pertinent Labs: Reviewed; 03/25/21 Glucose 209H, Glucose POCT 177H, Hgb 13L, Hct 37L    Reviewed I/O's  Enteral or parenteral access: None    Nutrition Hx: Hx of IDDM type 2, hypercholesterolemia, HTN. Improvement in PO intake noted, 03/22/21 100%.    Food allergies: NKFA    Current diet: Consistent Carbohydrate  Supplements: Potassium chloride    Nutrition Focused Physical Exam:  Edema: None  Abdomen: Soft  Skin: Intact    Anthropometrics:  Height: 189.2 cm (6' 2.5")    Current Weight: 123.7 kg (272 lb 11.3 oz); 128% IBW; 99% UBW  UBW: 124.7 kg  Ideal Body  Weight: 96.5 kg + 10%  BMI: 34.55 kg/(m^2) Obese, Class I  Weight Hx: No significant weight changes in last 30 days or 6 months noted.    Estimated Nutrient Needs:   (Based on 123.7 kg)                 2414 kcal/day (REE x 1.15 AF)              99 g protein/day (0.8 g/kg)               2414 mL fluid/day (1 mL/kcal)    Nutrition Assessment and Diagnosis:   1.    Altered nutrition-related laboratory values related to acute illness and hx of IDDM type 2 as evidenced by 03/25/21 Glucose 209H, Glucose POCT 177H, Hgb 13L, Hct 37L.  2.    Food- and nutrition-related knowledge deficit related to lack of access to diabetes education/counseling as evidenced by uncontrolled IDDM type 2 and 03/25/21 Glucose 209H, Glucose POCT 177H.  3.    Obese, Class I related to excessive energy intake and/or decreased physical activity prior to acute illness as evidenced by BMI 34.55.    Nutrition Intervention:   1.   RDN will continue to  provide nutrition counseling and/or education PRN.   2.   Nutrition staff will continue to obtain and honor food preferences in compliance with diet order.    Nutrition Monitoring/Evaluation:   1. Monitor via rounds: Diet tolerance and intake, nutrition-related labs, weight trend and BM pattern.   2. Nutrition to follow up per high nutrition risk protocol.

## 2021-03-25 NOTE — Anesthesia Preprocedure Evaluation (Addendum)
Anesthesia Pre-operative History and Physical for Craig Gilbert    Highlighted Issues for this Procedure:  65 y.o. male with infected left hip presenting for Procedure(s) (LRB):  IRRIGATION AND DEBRIDEMENT, LOWER EXTREMITY (Left) by Surgeon(s):  Axtell, Ignacia Palma, MD scheduled for  minutes.        .  .  Anesthesia Evaluation Information Source: patient, records     ANESTHESIA HISTORY  Pertinent(-):  No History of anesthetic complications    GENERAL    + Obesity  Pertinent (-):  No history of anesthetic complications    HEENT     Denies HEENT issues PULMONARY    + Smoker          tobacco currently    CARDIOVASCULAR    + Hypertension    + Anticoagulants          apixaban    + Hx of DVT          with PE    GI/HEPATIC/RENAL       + Renal Issues NEURO/PSYCH    + Neuropsychiatric Issues          anxiety    + Peripheral Nerve Issue          peripheral neuropathy    + Neuromuscular disease    + Gait/Mobility issues    ENDO/OTHER    + Diabetes Mellitus        Type 2    HEMATOLOGIC    + Anticoagulants/Antiplatelets          apixaban    + Blood dyscrasia          hyperlipidemia    + Arthritis         Physical Exam Not Completed________________________________________________________________________  PLAN  ASA Score  3  Anesthetic Plan general       Induction (routine IV) General Anesthesia/Sedation Maintenance Plan (inhaled agents and IV bolus); Airway (LMA); Line ( use current access); Monitoring (standard ASA); Positioning (supine); PONV Plan (dexamethasone and ondansetron); Pain (per surgical team); PostOp (PACU)    Informed Consent     Responsible Anesthesia Provider Attestation:  I attest that the patient or proxy understands and accepts the risks and benefits of the anesthesia plan. I also attest that I have personally performed a pre-anesthetic examination and evaluation, and prescribed the anesthetic plan for this particular location within 48 hours prior to the anesthetic as documented. Sherlean Foot, CRNA   03/25/21, 8:54 AM

## 2021-03-25 NOTE — Progress Notes (Signed)
[facility] MEDICINE PROGRESS NOTE    SUBJECTIVE  Patient seen and examined at bedside in the morning.    Discussed the patient's care with the patient himself and orthopedic surgery, due to a concern for periosteal inflammation abscess and in the setting of a left lower extremity DVT, and prefers a tertiary care facility with the request of higher level of care per discussion with orthopedic and patient.Craig Gilbert    VITAL SIGNS  BP (P) 130/77    Pulse (P) 60    Temp (P) 37.2 C (99 F) (Oral)    Resp (P) 18    Ht 1.892 m (6' 2.5")    Wt 123.7 kg (272 lb 11.3 oz)    SpO2 (P) 95%    BMI 34.55 kg/m     Intake/Output Summary (Last 24 hours) at 03/25/2021 1706  Last data filed at 03/25/2021 0159  Gross per 24 hour   Intake 470 ml   Output 650 ml   Net -180 ml         PHYSICAL EXAM  Constitutional:   not in acute distress. Normal appearance.  Not  ill-appearing, toxic-appearing or diaphoretic.   HENT: Normocephalic and atraumatic.    Eyes: : Extraocular movements intact.  Pupils are equal, round, and reactive to light.   Cardiovascular:  Normal rate and regular rhythm. Normal pulses.  Normal heart sounds.   Pulmonary: Pulmonary effort is normal.  Air movement present.   Abdominal: Abdomen is flat.  Abdomen is soft. There is no right CVA tenderness or left CVA tenderness.   Musculoskeletal:   Normal range of motion.  Left hip pain and tenderness, mostly on the lateral aspect of the left hip, inguinal lymphadenopathy limited range of motion secondary to pain  Skin:Skin is warm and dry.  Capillary refill takes less than 2 seconds.   Neurological: No focal deficit present. He is alert and oriented to person, place, and time. No cranial nerve deficit.   Psychiatric:    Mood normal, Behavior normal.      Lab Results:   Recent Labs   Lab 03/25/21  0546 03/24/21  0556 03/23/21  0553 03/22/21  0430 03/21/21  0517 03/20/21  1456 03/19/21  2233   WBC 6.4 6.0 5.9   < > 12.7* 15.9* 14.1*   Hemoglobin 13.0* 12.8* 13.1*   < > 14.3 15.7 16.7    Hematocrit 37* 36* 37*   < > 41 44 47   Platelets 187 170 153   < > 157 174 199   Seg Neut %  --   --   --   --  84.4 88.7 87.8   Lymphocyte %  --   --   --   --  8.2 4.4 6.1   Monocyte %  --   --   --   --  6.3 6.1 4.9   Eosinophil %  --   --   --   --  0.1 0.0 0.7    < > = values in this interval not displayed.       Recent Labs   Lab 03/25/21  0546 03/24/21  0556 03/23/21  0553   Sodium 134 133 132*   Potassium 3.5 3.3* 3.6   CO2 25 24 22    UN 9 11 14    Creatinine 0.76 0.75 0.76   Glucose 209* 153* 316*   Calcium 8.4* 8.3* 8.1*       Recent Labs   Lab 03/20/21  1456  INR 1.3*   aPTT 25.9   Protime 15.2*     No components found with this basename: APTT      Recent Labs   Lab 03/21/21  1214 03/21/21  0517 03/19/21  2233   Alk Phos 103 112 145*   Bilirubin,Total 0.4 0.5 0.7   Albumin 3.1* 3.0* 4.2   ALT 10 11 16    AST 9 11 13    Total Protein 6.3 6.6 8.1*       Glucose       Lab results: 03/25/21  1147 03/25/21  0626 03/25/21  0546 03/24/21  1953 03/24/21  1740 03/24/21  1202 03/24/21  0556 03/23/21  1212 03/23/21  0553 03/22/21  0841 03/22/21  0430 03/21/21  1706 03/21/21  1214   Glucose  --   --  209*  --   --   --  153*  --  316*  --  235*  --  235*   Glucose POCT 177* 187*  --  253* 127* 311*  --    < >  --    < >  --    < >  --     < > = values in this interval not displayed.       Lab Results   Component Value Date    HA1C 10.4 (H) 11/19/2020                                             Imaging/Diagnostics (last 3 days)     MR femur/thigh LEFT without and with contrast    Addendum Date: 03/25/2021    -------- ADDENDUM #1 -------- Please correct the report to indicate     Result Date: 03/25/2021  Pattern consistent with femoral osteomyelitis and possible periosteal abscesses, consider aspiration for definitive diagnosis. Motion limited study. [facility] submits this DICOM format image data and final report to the [facility], an independent secure electronic health information exchange, on a reciprocally  searchable basis (with patient authorization) for a minimum of 12 months after exam date. -------- ORIGINAL REPORT -------- 03/24/2021 4:56 PM MRI LEFT FEMUR WITHOUT AND WITH CONTRAST CLINICAL INFORMATION:  Concern for osteo of L femoral neck. COMPARISON:  CT left femur March 22, 2021 PROCEDURE: Multiplanar multisequence MRI was performed before and following administration of intravenous contrast.  Amount and type of contrast that was injected and/or discarded is recorded in the electronic medical record. FINDINGS: Motion limited examination There is abnormal enhancement of the proximal femoral shaft bone marrow, with cortical thinning posteriorly, cannot exclude osteomyelitis with marrow involvement. Multi septated masses adjacent to the femoral neck extending down anterior and medial to the femoral shaft demonstrate marginal and septal enhancement, a pattern suggestive of inflammatory change, possible abscesses. Consider aspiration for definitive diagnosis. Cystic neoplasm would be less likely. MISCELLANEOUS: Pattern consistent with femoral osteomyelitis and possible periosteal abscesses, consider aspiration for definitive diagnosis. Motion limited study. IMPRESSION: Normal. END OF IMPRESSION [facility] submits this DICOM format image data and final report to the [facility], an independent secure electronic health information exchange, on a reciprocally searchable basis (with patient authorization) for a minimum of 12 months after exam date.    CT pelvis with contrast    Result Date: 03/22/2021  Multiloculated cystic lesion centered around the LEFT hip, suspicious for a large multiloculated abscess. There is also edema in the  LEFT femoral neck/intertrochanteric, suspicious for osteomyelitis. Consider fluid sampling. A bland bursitis is less likely given the overall appearance and rapid development over several months. Foley catheter in empty bladder. Perivesical edema, possible cystitis. New low-attenuation area  in the prostate. Together these findings suggest a urinary infection which may have spread to the bursa/soft tissues around the hip. Discussed with covering provider at about 8:00 PM. END IMPRESSION I have personally reviewed the images and the Resident's/Fellow's interpretation and agree with or edited the findings. [facility] submits this DICOM format image data and final report to the [facility], an independent secure electronic health information exchange, on a reciprocally searchable basis (with patient authorization) for a minimum of 12 months after exam date.    US doppler vein bilateral lower extremities    Result Date: 03/24/2021  No deep vein thrombosis in the right lower extremity veins. However, partial, nonocclusive DVT in the left common femoral vein. Results were communicated to Dr. Red Christians at 8:25 AM on 03/24/2021. END OF IMPRESSION [facility] submits this DICOM format image data and final report to the [facility], an independent secure electronic health information exchange, on a reciprocally searchable basis (with patient authorization) for a minimum of 12 months after exam date.            [facility] Problems    Diagnosis     Leg abscess          CURRENT MEDICATIONS    SCHEDULED MEDS:   nicotine  1 patch Transdermal Daily    insulin glargine  20 units Subcutaneous Nightly    insulin lispro  0-9 units Subcutaneous TID WC    fluconazole  200 mg Intravenous Q24H    vancomycin  1,500 mg Intravenous Q8H    tamsulosin  0.8 mg Oral QPM    ceFEPime (MAXIPIME) IV  2,000 mg Intravenous Q8H    pantoprazole  40 mg Oral QAM    atorvastatin  10 mg Oral Daily    FLUoxetine  10 mg Oral Daily    gabapentin  300 mg Oral Nightly    lisinopril  5 mg Oral Daily    pramipexole  1.5 mg Oral Nightly       CONTINUOUS INFUSIONS:   Vancomycin - Pharmacist to Dose         PRN MEDS:.HYDROcodone-acetaminophen, HYDROcodone-acetaminophen, acetaminophen, promethazine, ondansetron,  Nursing communication- Give 4 OZ of fruit juice for BG < 70 mg/dl **AND** dextrose **AND** dextrose **AND** glucagon **AND** POCT glucose         ASSESSMENT AND PLAN    Sepsis secondary to abscess at left femur(tachypnea, leukocytosis, source of infection present)  Pseudomonas bacteremia/septicemia likely source UTI and abscess  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 Osker Mason who concurs the need for higher level of care as needed  -Follow repeat blood cultures, initial blood cultures growing Pseudomonas, sensitive to cefepime  -Follow catheter tip cultures which are growing Candida and Pseudomonas, catheter exchanged, continue with fluconazole     Anxiety              Continue home dose of clonazepam 1 mg po daily  Diabetes Mellitus              Accu-Cheks AC at bedtime, basal bolus regimen, nighttime dose increased    HTN                            Continue home dose of Lisinopril 5 mg po daily    Hyperlipidemia              Continue home dose of Atorvastatin 10 mg po q hS    Indwelling Urinary Catheter s/p cystoscopy  Urinary retention  -Foley reinserted, follow-up with urology as an outpatient, catheter tip showing Candida and Pseudomonas,    Pulmonary Embolism-on anticoagulation with apixaban  Patient is on low-dose apixaban??  Partial, nonocclusive DVT in the left common femoral vein  -Hold Lovenox in anticipation for surgery, resume after consultation with ortho  -Patient will need full dose apixaban on discharge    Restless Leg syndrome              Continue home dose of Pramipexole 1.5 mg po q HS    DVT prophylaxis:  Chemoprophylaxis on hold given surgery tomorrow    Accepted to [facility] - under Dr. Daneil Dolin, pending bed availability.     Code Status:   Code Status Information     Code Status Advance Care Planning    Full Code Jump to the Activity           DISPOSITION:Home      AUTHOR: Fidela Salisbury, MD

## 2021-03-25 NOTE — Progress Notes (Signed)
Vancomycin Maintenance Regimen - Pharmacist to Dose    Patient is receiving Vancomycin 1500 mg Q8H for Bacteremia. Today is day 6 of therapy.    Goal vancomycin trough is 15-20 mcg/mL.    Laboratory Data      Lab results: 03/25/21  0546 03/24/21  0556 03/23/21  0553   WBC 6.4 6.0 5.9         Lab results: 03/25/21  0546 03/24/21  0556 03/23/21  0553   Creatinine 0.76 0.75 0.76         Lab results: 03/25/21  0546 03/24/21  0556 03/23/21  0553   UN 9 11 14      Vancomycin concentrations:       Lab results: 03/25/21  1332   Vancomycin Trough 19.9       Assessment and Plan   Considering patient's renal function, volume status, clinical status, and reported concentrations,   mg   will obtain goal vancomycin concentration unless renal function or clinical status changes.    Next vancomycin concentration before the 4th or 5th dose, on 03/26/21 @1300 .   Pharmacist will follow for changes in renal function, toxicity, and efficacy and order serum concentrations and creatinine as needed.        For questions contact pharmacy.    Harriet Masson, PharmD

## 2021-03-25 NOTE — Progress Notes (Signed)
HPI: Left hip pain with infection      SUBJECTIVE: Doing well, still having pain with ambulation, denies fevers, sweats or chills,       OBJECTIVE: Has had CT and MRI scans of proximal femur and pelvis, showing fluid collection around the femur, loculated and intraosseus swelling consistent with osteomyelitis. CRP and procalcitonin levels have improved.  Sugars under good control, WBC normal.       ASSESSMENT: Had long talk with radiologist about radiologic findings,      PLAN: Discussions with Hospitalist recommend irrigation and debridement of proximal femoral area, including bone which shows thinning and possible osteomyelitis.  After discussion with patient, and recent US of leg + for DVT, patient has requested a higher level of care.  We think that this is appropriate and will pursue transfer to tertiary care center for further treatment of this complex problem.

## 2021-03-25 NOTE — Discharge Summary (Signed)
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  March 14, 2021 had a cystoscopy at [facility] per Dr.Alam, Urologist.  March 19, 2021 came to the ED with a 2-3 day history of Bladder spasms, intermittent SOB, Generalized weakness, Fatigue, Nausea, Vomiting, Fever, Chills, Pain, and discomfort at foley catheter insertion site.  Abdominal/Pelvic CT Scan showed Bladder mual thickening and mild perivesicular stranding, suspicious for cystitis. Interval decrease in extent of now mild to moderate left hydroureteronephrosis with subtle inflammatory changes adjacent to the left UPJ.  Interval development of multilocualted fluid collection adjacent to the proximal left femur/lesser trochanter, likely representing iliopsoas bursitis Urinalysis shows 3 (+) blood, Trace Leuk Estrase, and WBC >50.  Has also had left hip pain, medial and lateral aspect of thigh, since November 2021.  Left AGAINST MEDICAL ADVICE.  Patient returned to the Emergency Department March 20, 2021 with continued symptoms  WBC 15.9  Yesterday's WBC 14.1  Lactate 2.9  INR 1.3.     Initial urine culture  grew pan susceptible Pseudomonas and Candida albicans, initial blood culture also with pan susceptible Pseudomonas.  His case was discussed with orthopedic surgery with initial intent for nonoperative management for potential left hip abscess however given persistent pain and elevated inflammatory markers patient underwent a MRI of his Left Lower extremity, with abnormal enhancement of the proximal femoral shaft bone marrow, with cortical thinning posteriorly, cannot exclude osteomyelitis with marrow involvement. Multi septated masses adjacent to the femoral neck extending down anterior and medial to   the femoral shaft demonstrate marginal and septal enhancement, a pattern suggestive of inflammatory change, possible abscesses.   Furthermore patient was also found to have a nonocclusive DVT in the left common femoral vein, as he complained of persistent pain, this has been treated with Lovenox 1 mg/kg in anticipation of pending surgical intervention.    It is to be noted that the patient previously was on 2.5 twice daily Eliquis despite a history of pulmonary embolism.    Over the course of the hospitalization patient white count inflammatory markers have responded to broad-spectrum antibiotics with vancomycin and cefepime, with last trough 18.9, blood cultures from 03/19/2021 positive for Pseudomonas however blood cultures from 03/20/2021 negative thus far.  Patient is also maintained on fluconazole with Foley exchanged on presentation.    Upon discussion with orthopedic surgery on 03/25/2021, due to need for irrigation debridement of proximal femoral area including bone which shows thinning and possible osteomyelitis also a DVT a request for higher level of care has been made.  Therefore his case was subsequently discussed with the representation of  orthopedic surgery as well as hospitalist medicine at [facility] and care is transferred in a hemodynamically stable condition.                                      Signed: Fidela Salisbury, MD  On: 03/25/2021  at: 6:47 PM

## 2021-03-25 NOTE — Progress Notes (Signed)
Patient does not need skilled OT services at this time.  He was not able to complete LE dressing during initial evaluation, due to pain only, otherwise he is independent with bed mobility/transfers and self care.  He may be getting transferred to another facility at this time for a higher level of care.

## 2021-03-26 ENCOUNTER — Encounter: Payer: Self-pay | Admitting: Gastroenterology

## 2021-03-26 DIAGNOSIS — E785 Hyperlipidemia, unspecified: Secondary | ICD-10-CM | POA: Insufficient documentation

## 2021-03-26 DIAGNOSIS — I10 Essential (primary) hypertension: Secondary | ICD-10-CM | POA: Insufficient documentation

## 2021-03-26 DIAGNOSIS — F419 Anxiety disorder, unspecified: Secondary | ICD-10-CM | POA: Insufficient documentation

## 2021-03-26 DIAGNOSIS — E119 Type 2 diabetes mellitus without complications: Secondary | ICD-10-CM | POA: Insufficient documentation

## 2021-03-26 DIAGNOSIS — M86152 Other acute osteomyelitis, left femur: Secondary | ICD-10-CM | POA: Insufficient documentation

## 2021-03-26 DIAGNOSIS — I82402 Acute embolism and thrombosis of unspecified deep veins of left lower extremity: Secondary | ICD-10-CM | POA: Insufficient documentation

## 2021-03-26 LAB — BLOOD CULTURE
Bacterial Blood Culture: 0
Bacterial Blood Culture: 0

## 2021-03-28 ENCOUNTER — Encounter: Payer: Self-pay | Admitting: Urology

## 2021-03-28 NOTE — Telephone Encounter (Signed)
S/P cysto with internal urethreromy 03/14/21   Patient currently in rehab/hospital  Do we have a plan for follow up? Unable to pass void trial, failed x2 in hospital  Does not have a follow up appointment currently due to hospitalization, just hoping to give him an idea of what options/plans we would have.   Please advise, thanks Lovena Le

## 2021-03-31 ENCOUNTER — Encounter: Payer: Self-pay | Admitting: Urology

## 2021-04-02 ENCOUNTER — Inpatient Hospital Stay: Admit: 2021-04-02 | Discharge: 2021-04-02 | Disposition: A | Discharge status: Home / Self Care | Payer: Self-pay

## 2021-04-05 ENCOUNTER — Emergency Department
Admission: EM | Admit: 2021-04-05 | Discharge: 2021-04-05 | Disposition: A | Discharge status: Home / Self Care | Payer: Medicaid Other | Source: Ambulatory Visit | Attending: Student in an Organized Health Care Education/Training Program | Admitting: Student in an Organized Health Care Education/Training Program

## 2021-04-05 ENCOUNTER — Emergency Department: Payer: Medicaid Other

## 2021-04-05 DIAGNOSIS — G8918 Other acute postprocedural pain: Secondary | ICD-10-CM | POA: Insufficient documentation

## 2021-04-05 DIAGNOSIS — M25552 Pain in left hip: Secondary | ICD-10-CM

## 2021-04-05 DIAGNOSIS — Z20822 Contact with and (suspected) exposure to covid-19: Secondary | ICD-10-CM | POA: Insufficient documentation

## 2021-04-05 DIAGNOSIS — M6289 Other specified disorders of muscle: Secondary | ICD-10-CM

## 2021-04-05 LAB — CBC AND DIFFERENTIAL
Baso # K/uL: 0 10*3/uL (ref 0.0–0.1)
Basophil %: 0.6 %
Eos # K/uL: 0.2 10*3/uL (ref 0.0–0.5)
Eosinophil %: 3.2 %
Hematocrit: 33 % — ABNORMAL LOW (ref 40–51)
Hemoglobin: 11.3 g/dL — ABNORMAL LOW (ref 13.7–17.5)
IMM Granulocytes #: 0.1 10*3/uL — ABNORMAL HIGH (ref 0.0–0.0)
IMM Granulocytes: 0.7 %
Lymph # K/uL: 1.4 10*3/uL (ref 1.3–3.6)
Lymphocyte %: 20.9 %
MCH: 30 pg (ref 26–32)
MCHC: 34 g/dL (ref 32–37)
MCV: 89 fL (ref 79–92)
Mono # K/uL: 0.6 10*3/uL (ref 0.3–0.8)
Monocyte %: 8.4 %
Neut # K/uL: 4.6 10*3/uL (ref 1.8–5.4)
Nucl RBC # K/uL: 0 10*3/uL (ref 0.0–0.0)
Nucl RBC %: 0 /100 WBC (ref 0.0–0.2)
Platelets: 251 10*3/uL (ref 150–330)
RBC: 3.7 MIL/uL — ABNORMAL LOW (ref 4.6–6.1)
RDW: 12.4 % (ref 11.6–14.4)
Seg Neut %: 66.2 %
WBC: 6.9 10*3/uL (ref 4.2–9.1)

## 2021-04-05 LAB — BASIC METABOLIC PANEL
Anion Gap: 10 (ref 7–16)
CO2: 27 mmol/L (ref 20–28)
Calcium: 9.2 mg/dL (ref 8.6–10.2)
Chloride: 103 mmol/L (ref 96–108)
Creatinine: 0.78 mg/dL (ref 0.67–1.17)
Glucose: 193 mg/dL — ABNORMAL HIGH (ref 60–99)
Lab: 16 mg/dL (ref 6–20)
Potassium: 4.2 mmol/L (ref 3.4–4.7)
Sodium: 140 mmol/L (ref 133–145)
eGFR BY CREAT: 100 *

## 2021-04-05 LAB — COVID-19 NAAT (PCR): COVID-19 NAAT (PCR): NEGATIVE

## 2021-04-05 LAB — PERFORMING LAB

## 2021-04-05 MED ORDER — HYDROCODONE-ACETAMINOPHEN 5-325 MG PO TABS *I*
1.0000 | ORAL_TABLET | Freq: Once | ORAL | Status: AC
Start: 2021-04-05 — End: 2021-04-05
  Administered 2021-04-05: 1 via ORAL
  Filled 2021-04-05: qty 1

## 2021-04-05 MED ORDER — HYDROMORPHONE HCL PF 1 MG/ML IJ SOLN *WRAPPED*
0.5000 mg | Freq: Once | INTRAMUSCULAR | Status: AC
Start: 2021-04-05 — End: 2021-04-05
  Administered 2021-04-05: 0.5 mg via INTRAVENOUS
  Filled 2021-04-05: qty 1

## 2021-04-05 MED ORDER — HYDROCODONE-ACETAMINOPHEN 5-325 MG PO TABS *I*
1.0000 | ORAL_TABLET | Freq: Four times a day (QID) | ORAL | 0 refills | Status: AC | PRN
Start: 2021-04-05 — End: 2021-04-08

## 2021-04-05 MED ORDER — IOHEXOL 350 MG/ML (OMNIPAQUE) IV SOLN *I*
1.0000 mL | Freq: Once | INTRAVENOUS | Status: AC
Start: 2021-04-05 — End: 2021-04-05
  Administered 2021-04-05: 100 mL via INTRAVENOUS

## 2021-04-05 NOTE — ED Provider Notes (Signed)
History     Chief Complaint   Patient presents with    Leg Pain     This is a 65 year old male with history as listed significant for prior PE, diabetes, hypertension, recent admission for hip pain following urethral stricture surgery found to have septic trochanteric bursitis status post incision and drainage and washout at [facility] discharged 4 days ago presenting for evaluation of worsening pain in his hip.  He states he ran out of his OxyContin because he lost it.  He reports that he has new pain along the medial aspect of his left thigh.  This pain is worse when he attempts to stand and it is impacting his ability to perform his activities of daily living.  There is initially a discussion while he was hospitalized regarding sending him to a rehab facility however the patient declined at that time.  He states "I bit off more than I can chew" and is now interested in going to a rehab facility.          Medical/Surgical/Family History     Past Medical History:   Diagnosis Date    Anxiety     Arthritis     DM (diabetes mellitus)     High cholesterol     HTN (hypertension)     Hydronephrosis     Nerve pain     PE (pulmonary thromboembolism)     Restless leg syndrome     Sciatica     Urethral stricture     Urinary retention         Patient Active Problem List   Diagnosis Code    Leg abscess L02.419            Past Surgical History:   Procedure Laterality Date    PR CYSTOURETHROSCOPY N/A 01/07/2021    Procedure: urethroscopy;  Surgeon: Lowella Dandy, MD;  Location: [facility] MAIN OR;  Service: Urology    PR CYSTOURETHROSCOPY N/A 03/14/2021    Procedure: CYSTOSCOPY with  internal uretheromy;  Surgeon: Lowella Dandy, MD;  Location: [facility] MAIN OR;  Service: Urology    [place]     History reviewed. No pertinent family history.       Social History     Tobacco Use    Smoking status: Former Smoker     Years: 57.00     Types: Cigarettes     Start date: 1964     Smokeless tobacco: Never Used   Substance Use Topics    Alcohol use: Not Currently    Drug use: Yes     Types: Marijuana     Living Situation     Questions Responses    Patient lives with     Homeless     Caregiver for other family member     External Services     Employment     Domestic Violence Risk                 Review of Systems   Review of Systems   Constitutional: Negative for chills and fever.   HENT: Negative for sore throat.    Respiratory: Negative for cough and shortness of breath.    Cardiovascular: Negative for chest pain.   Gastrointestinal: Negative for abdominal pain, constipation, diarrhea, nausea and vomiting.   Genitourinary: Negative for dysuria.   Musculoskeletal: Negative for back pain.   Skin: Negative for rash.   Allergic/Immunologic: Negative for immunocompromised state.   Neurological: Negative  for headaches.       Physical Exam     Triage Vitals  Triage Start: Start, (04/05/21 1629)   First Recorded BP: 99/70, Resp: 12, Temp: 36.9 C (98.4 F), Temp src: Tympanic Oxygen Therapy SpO2: 96 %, Oximetry Source: Rt Hand, O2 Device: None (Room air), Heart Rate: 93, (04/05/21 1631)  .  First Pain Reported  0-10 Scale: 10, Pain Location/Orientation: Hip Right, (04/05/21 1631)       Physical Exam  Vitals and nursing note reviewed.   Constitutional:       General: He is not in acute distress.     Appearance: Normal appearance. He is normal weight. He is not ill-appearing, toxic-appearing or diaphoretic.   HENT:      Head: Normocephalic and atraumatic.      Nose: Nose normal. No congestion.   Eyes:      General: No scleral icterus.        Right eye: No discharge.         Left eye: No discharge.      Conjunctiva/sclera: Conjunctivae normal.   Cardiovascular:      Rate and Rhythm: Normal rate.      Pulses: Normal pulses.      Heart sounds: Normal heart sounds. No murmur heard.    No friction rub. No gallop.   Pulmonary:      Effort: Pulmonary effort is normal.      Breath sounds: Normal breath  sounds. No stridor. No wheezing, rhonchi or rales.   Abdominal:      General: Abdomen is flat.      Palpations: Abdomen is soft.      Tenderness: There is no abdominal tenderness.   Genitourinary:     Penis: Normal.       Comments: Foley in place, ttp of left groin, well healing incision of the left hip  Musculoskeletal:      Cervical back: Neck supple. No rigidity.   Skin:     General: Skin is warm and dry.      Capillary Refill: Capillary refill takes less than 2 seconds.   Neurological:      General: No focal deficit present.      Mental Status: He is alert. Mental status is at baseline.   Psychiatric:         Mood and Affect: Mood normal.         Behavior: Behavior normal.         Medical Decision Making   Patient seen by me on:  04/05/2021    Assessment:  This is a 65 year old male presenting for evaluation of postoperative hip pain.  Given that his pain has migrated I am concerned there may be a new postoperative abscess given that he was recently undergoing a washout however he is currently on antibiotics or PICC line so this would be less likely.  I believe it is also reasonable to check basic blood work.  He may require admission to rehab facility.    Differential diagnosis:  Seroma  Postop abscess  Postoperative pain    Plan:  Orders Placed This Encounter      CT pelvis with contrast      Basic metabolic panel      CBC and differential      COVID-19 PCR      Performing lab      Insert peripheral IV    Dilaudid    ED Course and Disposition:  Patient is CT redemonstrates the same  loculated collections that were present prior to his transfer to [facility] for a higher level of care in order to undergo washout.  It raises the question of what exactly they found when they opened him up to perform a washout.  I cannot say definitively whether he has an infection there or not however the fact that it is unchanged makes me think this is more likely to be a neoplastic process.  He has had tissue samples  sent to the [facility].    We discussed further physical therapy and the patient states he wishes to try again at home with a new prescription for Norco.  I stop queried.  He states that if his pain gets worse at any time he will go back to the hospital he had been at for his surgery.            Jennette Dubin, MD          Jennette Dubin, MD  04/05/21 2030

## 2021-04-05 NOTE — Discharge Instructions (Signed)
Your CT says the following:    "According to the provided clinical history the patient is status post washout of the left hip.     There persists a large multiloculated mass or fluid collection involving multiple muscle groups surrounding the left proximal femur spanning a region of approximately 11 x 13 x 14 cm. On the previous MRI there is also abnormal bone marrow signal   throughout the left proximal to mid femoral diaphysis however a relative lack of cortical bone destruction.     While findings may relate to multiple intramuscular abscesses and osteomyelitis of the femur it is worth considering other etiologies given the relative lack of bone destruction. Findings are not significantly changed from the previous CT scan even after    the surgery.     Consider the possibility of a small round blue cell tumor involving the left femur with spread into the surrounding soft tissues. For example lymphoma or metastatic disease. Correlate clinically if any biopsy or microbiology was obtained at the time of   the left hip washout. Ultrasound guided biopsy of the soft tissue fluid collection or mass could be considered for further workup.  "    We have written you a prescription for several tablets of pain medication.  Please take every 6 hours as needed for pain.  As we discussed your CT scan here today does not show an obvious new cause of your pain.  It may be related to your recent surgery and therefore we recommend you follow-up with the surgeons that performed your procedure at [facility] as soon as possible for repeat evaluation.  Please return immediately if you have any further complaints.

## 2021-04-05 NOTE — ED Triage Notes (Signed)
Patient presents to ER with complaints of left hip surgery 5 days ago he has been home 3 days. He had a surgery he reports to have an infection in his hip removed. He states that he lost his pain medication, and cant deal with the pain he is having            Prehospital medications given: No

## 2021-04-13 ENCOUNTER — Inpatient Hospital Stay: Payer: Medicaid Other

## 2021-04-13 ENCOUNTER — Inpatient Hospital Stay
Admission: EM | Admit: 2021-04-13 | Discharge: 2021-04-18 | DRG: 720 | Payer: Medicaid Other | Source: Ambulatory Visit | Attending: Student in an Organized Health Care Education/Training Program | Admitting: Student in an Organized Health Care Education/Training Program

## 2021-04-13 DIAGNOSIS — M25552 Pain in left hip: Secondary | ICD-10-CM

## 2021-04-13 DIAGNOSIS — M009 Pyogenic arthritis, unspecified: Secondary | ICD-10-CM | POA: Diagnosis present

## 2021-04-13 DIAGNOSIS — A419 Sepsis, unspecified organism: Principal | ICD-10-CM

## 2021-04-13 DIAGNOSIS — I82502 Chronic embolism and thrombosis of unspecified deep veins of left lower extremity: Secondary | ICD-10-CM | POA: Diagnosis present

## 2021-04-13 DIAGNOSIS — F1320 Sedative, hypnotic or anxiolytic dependence, uncomplicated: Secondary | ICD-10-CM | POA: Diagnosis present

## 2021-04-13 DIAGNOSIS — Z01818 Encounter for other preprocedural examination: Secondary | ICD-10-CM

## 2021-04-13 DIAGNOSIS — R682 Dry mouth, unspecified: Secondary | ICD-10-CM

## 2021-04-13 DIAGNOSIS — Z87891 Personal history of nicotine dependence: Secondary | ICD-10-CM

## 2021-04-13 DIAGNOSIS — R638 Other symptoms and signs concerning food and fluid intake: Secondary | ICD-10-CM

## 2021-04-13 DIAGNOSIS — G8929 Other chronic pain: Secondary | ICD-10-CM | POA: Diagnosis present

## 2021-04-13 DIAGNOSIS — M5432 Sciatica, left side: Secondary | ICD-10-CM | POA: Diagnosis present

## 2021-04-13 DIAGNOSIS — L02419 Cutaneous abscess of limb, unspecified: Secondary | ICD-10-CM

## 2021-04-13 DIAGNOSIS — R451 Restlessness and agitation: Secondary | ICD-10-CM

## 2021-04-13 DIAGNOSIS — R231 Pallor: Secondary | ICD-10-CM

## 2021-04-13 DIAGNOSIS — M84652A Pathological fracture in other disease, left femur, initial encounter for fracture: Secondary | ICD-10-CM | POA: Diagnosis present

## 2021-04-13 DIAGNOSIS — R42 Dizziness and giddiness: Secondary | ICD-10-CM

## 2021-04-13 DIAGNOSIS — E119 Type 2 diabetes mellitus without complications: Secondary | ICD-10-CM | POA: Diagnosis present

## 2021-04-13 DIAGNOSIS — M86152 Other acute osteomyelitis, left femur: Secondary | ICD-10-CM | POA: Diagnosis present

## 2021-04-13 DIAGNOSIS — F419 Anxiety disorder, unspecified: Secondary | ICD-10-CM | POA: Diagnosis present

## 2021-04-13 DIAGNOSIS — Z7901 Long term (current) use of anticoagulants: Secondary | ICD-10-CM

## 2021-04-13 DIAGNOSIS — D62 Acute posthemorrhagic anemia: Secondary | ICD-10-CM | POA: Diagnosis present

## 2021-04-13 DIAGNOSIS — Z86711 Personal history of pulmonary embolism: Secondary | ICD-10-CM

## 2021-04-13 DIAGNOSIS — Z20822 Contact with and (suspected) exposure to covid-19: Secondary | ICD-10-CM | POA: Diagnosis present

## 2021-04-13 DIAGNOSIS — I10 Essential (primary) hypertension: Secondary | ICD-10-CM | POA: Diagnosis present

## 2021-04-13 DIAGNOSIS — G2581 Restless legs syndrome: Secondary | ICD-10-CM | POA: Diagnosis present

## 2021-04-13 DIAGNOSIS — R339 Retention of urine, unspecified: Secondary | ICD-10-CM | POA: Diagnosis present

## 2021-04-13 DIAGNOSIS — K922 Gastrointestinal hemorrhage, unspecified: Secondary | ICD-10-CM

## 2021-04-13 DIAGNOSIS — E860 Dehydration: Secondary | ICD-10-CM | POA: Diagnosis present

## 2021-04-13 DIAGNOSIS — R61 Generalized hyperhidrosis: Secondary | ICD-10-CM

## 2021-04-13 DIAGNOSIS — E785 Hyperlipidemia, unspecified: Secondary | ICD-10-CM | POA: Diagnosis present

## 2021-04-13 DIAGNOSIS — K920 Hematemesis: Secondary | ICD-10-CM | POA: Diagnosis present

## 2021-04-13 DIAGNOSIS — Z7984 Long term (current) use of oral hypoglycemic drugs: Secondary | ICD-10-CM

## 2021-04-13 LAB — BASIC METABOLIC PANEL
Anion Gap: 22 — ABNORMAL HIGH (ref 7–16)
Anion Gap: 12 (ref 7–16)
CO2: 15 mmol/L — ABNORMAL LOW (ref 20–28)
CO2: 21 mmol/L (ref 20–28)
Calcium: 9.3 mg/dL (ref 8.6–10.2)
Calcium: 8.5 mg/dL — ABNORMAL LOW (ref 8.6–10.2)
Chloride: 99 mmol/L (ref 96–108)
Chloride: 101 mmol/L (ref 96–108)
Creatinine: 1.24 mg/dL — ABNORMAL HIGH (ref 0.67–1.17)
Creatinine: 0.9 mg/dL (ref 0.67–1.17)
Glucose: 275 mg/dL — ABNORMAL HIGH (ref 60–99)
Glucose: 171 mg/dL — ABNORMAL HIGH (ref 60–99)
Lab: 22 mg/dL — ABNORMAL HIGH (ref 6–20)
Lab: 20 mg/dL (ref 6–20)
Potassium: 4.3 mmol/L (ref 3.4–4.7)
Potassium: 4.2 mmol/L (ref 3.4–4.7)
Sodium: 136 mmol/L (ref 133–145)
Sodium: 134 mmol/L (ref 133–145)
eGFR BY CREAT: 65 *
eGFR BY CREAT: 96 *

## 2021-04-13 LAB — CBC AND DIFFERENTIAL
Baso # K/uL: 0 10*3/uL (ref 0.0–0.1)
Basophil %: 0.3 %
Eos # K/uL: 0.1 10*3/uL (ref 0.0–0.5)
Eosinophil %: 0.5 %
Hematocrit: 37 % — ABNORMAL LOW (ref 40–51)
Hemoglobin: 12.7 g/dL — ABNORMAL LOW (ref 13.7–17.5)
IMM Granulocytes #: 0.1 10*3/uL — ABNORMAL HIGH (ref 0.0–0.0)
IMM Granulocytes: 0.8 %
Lymph # K/uL: 0.9 10*3/uL — ABNORMAL LOW (ref 1.3–3.6)
Lymphocyte %: 6.5 %
MCH: 30 pg (ref 26–32)
MCHC: 35 g/dL (ref 32–37)
MCV: 87 fL (ref 79–92)
Mono # K/uL: 0.9 10*3/uL — ABNORMAL HIGH (ref 0.3–0.8)
Monocyte %: 7 %
Neut # K/uL: 11.2 10*3/uL — ABNORMAL HIGH (ref 1.8–5.4)
Nucl RBC # K/uL: 0 10*3/uL (ref 0.0–0.0)
Nucl RBC %: 0 /100 WBC (ref 0.0–0.2)
Platelets: 237 10*3/uL (ref 150–330)
RBC: 4.2 MIL/uL — ABNORMAL LOW (ref 4.6–6.1)
RDW: 12.6 % (ref 11.6–14.4)
Seg Neut %: 84.9 %
WBC: 13.1 10*3/uL — ABNORMAL HIGH (ref 4.2–9.1)

## 2021-04-13 LAB — PERFORMING LAB

## 2021-04-13 LAB — CBC
Hematocrit: 32 % — ABNORMAL LOW (ref 40–51)
Hemoglobin: 11.2 g/dL — ABNORMAL LOW (ref 13.7–17.5)
MCH: 30 pg (ref 26–32)
MCHC: 35 g/dL (ref 32–37)
MCV: 87 fL (ref 79–92)
Platelets: 213 10*3/uL (ref 150–330)
RBC: 3.7 MIL/uL — ABNORMAL LOW (ref 4.6–6.1)
RDW: 12.4 % (ref 11.6–14.4)
WBC: 8.6 10*3/uL (ref 4.2–9.1)

## 2021-04-13 LAB — URINALYSIS WITH MICROSCOPIC
Leuk Esterase,UA: NEGATIVE
Nitrite,UA: POSITIVE — AB
Specific Gravity,UA: 1.025 (ref 1.002–1.030)
WBC,UA: NONE SEEN /hpf (ref 0–5)
pH,UA: 5 (ref 5.0–8.0)

## 2021-04-13 LAB — LACTATE, PLASMA
Lactate: 7.6 mmol/L (ref 0.5–2.2)
Lactate: 1.2 mmol/L (ref 0.5–2.2)

## 2021-04-13 LAB — COVID-19 NAAT (PCR): COVID-19 NAAT (PCR): NEGATIVE

## 2021-04-13 LAB — CRP: CRP: 153 mg/L — ABNORMAL HIGH (ref 0–8)

## 2021-04-13 LAB — POCT GLUCOSE: Glucose POCT: 146 mg/dL — ABNORMAL HIGH (ref 60–99)

## 2021-04-13 LAB — MCHC: MCHC: 35 g/dL (ref 32–37)

## 2021-04-13 MED ORDER — VANCOMYCIN HCL 1750 MG/350ML IV SOLN (ROOM TEMPERATURE) *I*
1750.0000 mg | Freq: Two times a day (BID) | INTRAVENOUS | Status: DC
Start: 2021-04-13 — End: 2021-04-14
  Administered 2021-04-13 – 2021-04-14 (×3): 1750 mg via INTRAVENOUS
  Filled 2021-04-13 (×3): qty 350

## 2021-04-13 MED ORDER — GLUCOSE 40 % PO GEL *I*
15.0000 g | ORAL | Status: DC | PRN
Start: 2021-04-13 — End: 2021-04-18

## 2021-04-13 MED ORDER — PHENAZOPYRIDINE HCL 100 MG PO TABS *I*
100.0000 mg | ORAL_TABLET | Freq: Three times a day (TID) | ORAL | Status: DC | PRN
Start: 2021-04-13 — End: 2021-04-18

## 2021-04-13 MED ORDER — OXYCODONE HCL 5 MG PO TABS *I*
10.0000 mg | ORAL_TABLET | ORAL | Status: DC | PRN
Start: 2021-04-13 — End: 2021-04-18
  Administered 2021-04-13 – 2021-04-17 (×15): 10 mg via ORAL
  Filled 2021-04-13 (×15): qty 2

## 2021-04-13 MED ORDER — LISINOPRIL 10 MG PO TABS *I*
5.0000 mg | ORAL_TABLET | Freq: Every day | ORAL | Status: DC
Start: 2021-04-13 — End: 2021-04-13

## 2021-04-13 MED ORDER — TAMSULOSIN HCL 0.4 MG PO CAPS *I*
0.4000 mg | ORAL_CAPSULE | Freq: Every evening | ORAL | Status: DC
Start: 2021-04-13 — End: 2021-04-18
  Administered 2021-04-13 – 2021-04-17 (×5): 0.4 mg via ORAL
  Filled 2021-04-13 (×5): qty 1

## 2021-04-13 MED ORDER — CLONAZEPAM 1 MG PO TABS *I*
1.0000 mg | ORAL_TABLET | Freq: Two times a day (BID) | ORAL | Status: DC
Start: 2021-04-13 — End: 2021-04-18
  Administered 2021-04-13 – 2021-04-17 (×10): 1 mg via ORAL
  Filled 2021-04-13 (×6): qty 2
  Filled 2021-04-13 (×4): qty 1

## 2021-04-13 MED ORDER — HYDROMORPHONE HCL PF 1 MG/ML IJ SOLN *WRAPPED*
1.0000 mg | Freq: Once | INTRAMUSCULAR | Status: AC
Start: 2021-04-13 — End: 2021-04-13
  Administered 2021-04-13: 1 mg via INTRAVENOUS
  Filled 2021-04-13: qty 1

## 2021-04-13 MED ORDER — PANTOPRAZOLE SODIUM 40 MG IV SOLR *I*
40.0000 mg | Freq: Once | INTRAVENOUS | Status: AC
Start: 2021-04-13 — End: 2021-04-13
  Administered 2021-04-13: 40 mg via INTRAVENOUS
  Filled 2021-04-13: qty 10

## 2021-04-13 MED ORDER — CLONAZEPAM 0.5 MG PO TABS *I*
1.0000 mg | ORAL_TABLET | Freq: Two times a day (BID) | ORAL | Status: DC
Start: 2021-04-13 — End: 2021-04-13

## 2021-04-13 MED ORDER — HYDROMORPHONE HCL PF 1 MG/ML IJ SOLN *WRAPPED*
0.5000 mg | INTRAMUSCULAR | Status: DC | PRN
Start: 2021-04-13 — End: 2021-04-18
  Administered 2021-04-13 – 2021-04-17 (×9): 0.5 mg via INTRAVENOUS
  Filled 2021-04-13 (×9): qty 1

## 2021-04-13 MED ORDER — LORAZEPAM 1 MG PO TABS *I*
2.0000 mg | ORAL_TABLET | Freq: Once | ORAL | Status: AC
Start: 2021-04-13 — End: 2021-04-13
  Administered 2021-04-13: 2 mg via ORAL
  Filled 2021-04-13: qty 2

## 2021-04-13 MED ORDER — GLIMEPIRIDE 2 MG PO TABS *I*
4.0000 mg | ORAL_TABLET | Freq: Every day | ORAL | Status: DC
Start: 2021-04-13 — End: 2021-04-14
  Administered 2021-04-13 – 2021-04-14 (×2): 4 mg via ORAL
  Filled 2021-04-13 (×3): qty 2

## 2021-04-13 MED ORDER — HYDROMORPHONE HCL PF 1 MG/ML IJ SOLN *WRAPPED*
2.0000 mg | Freq: Once | INTRAMUSCULAR | Status: AC
Start: 2021-04-13 — End: 2021-04-13
  Administered 2021-04-13: 2 mg via INTRAVENOUS
  Filled 2021-04-13: qty 2

## 2021-04-13 MED ORDER — SODIUM CHLORIDE 0.9 % IV BOLUS *I*
1000.0000 mL | Freq: Once | Status: AC
Start: 2021-04-13 — End: 2021-04-13
  Administered 2021-04-13: 1000 mL via INTRAVENOUS

## 2021-04-13 MED ORDER — GABAPENTIN 300 MG PO CAPSULE *I*
300.0000 mg | ORAL_CAPSULE | Freq: Every evening | ORAL | Status: DC
Start: 2021-04-13 — End: 2021-04-18
  Administered 2021-04-13 – 2021-04-17 (×6): 300 mg via ORAL
  Filled 2021-04-13 (×6): qty 1

## 2021-04-13 MED ORDER — DEXTROSE 50 % IV SOLN *I*
25.0000 g | INTRAVENOUS | Status: DC | PRN
Start: 2021-04-13 — End: 2021-04-18

## 2021-04-13 MED ORDER — PANTOPRAZOLE SODIUM 40 MG IV SOLR *I*
40.0000 mg | INTRAVENOUS | Status: DC
Start: 2021-04-13 — End: 2021-04-18
  Administered 2021-04-13 – 2021-04-17 (×5): 40 mg via INTRAVENOUS
  Filled 2021-04-13 (×5): qty 10

## 2021-04-13 MED ORDER — ONDANSETRON HCL 2 MG/ML IV SOLN *I*
4.0000 mg | Freq: Once | INTRAMUSCULAR | Status: AC
Start: 2021-04-13 — End: 2021-04-13
  Administered 2021-04-13: 4 mg via INTRAVENOUS
  Filled 2021-04-13: qty 2

## 2021-04-13 MED ORDER — SODIUM CHLORIDE 0.9 % 25 ML IV SOLN *I*
25.0000 mg | Freq: Once | INTRAVENOUS | Status: AC
Start: 2021-04-13 — End: 2021-04-13
  Administered 2021-04-13: 25 mg via INTRAVENOUS
  Filled 2021-04-13: qty 1

## 2021-04-13 MED ORDER — VANCOMYCIN IV - PHARMACIST TO DOSE PLACEHOLDER *I*
Status: DC
Start: 2021-04-13 — End: 2021-04-18

## 2021-04-13 MED ORDER — NALOXONE HCL 0.4 MG/ML IJ SOLN *WRAPPED*
0.0400 mg | Status: DC | PRN
Start: 2021-04-13 — End: 2021-04-18

## 2021-04-13 MED ORDER — FLUOXETINE HCL 10 MG PO CAPS *I*
10.0000 mg | ORAL_CAPSULE | Freq: Every day | ORAL | Status: DC
Start: 2021-04-13 — End: 2021-04-13

## 2021-04-13 MED ORDER — GABAPENTIN 300 MG PO CAPSULE *I*
300.0000 mg | ORAL_CAPSULE | Freq: Every evening | ORAL | Status: DC
Start: 2021-04-13 — End: 2021-04-13

## 2021-04-13 MED ORDER — INSULIN GLARGINE 100 UNIT/ML SC SOLN *WRAPPED*
20.0000 [IU] | Freq: Every evening | SUBCUTANEOUS | Status: DC
Start: 2021-04-13 — End: 2021-04-18
  Administered 2021-04-13 – 2021-04-17 (×5): 20 [IU] via SUBCUTANEOUS
  Filled 2021-04-13 (×3): qty 0.2

## 2021-04-13 MED ORDER — ENOXAPARIN SODIUM 120 MG/0.8ML IJ SOSY *I*
1.0000 mg/kg | PREFILLED_SYRINGE | Freq: Two times a day (BID) | INTRAMUSCULAR | Status: DC
Start: 2021-04-13 — End: 2021-04-18
  Administered 2021-04-13 – 2021-04-17 (×9): 120 mg via SUBCUTANEOUS
  Filled 2021-04-13 (×9): qty 0.8

## 2021-04-13 MED ORDER — INSULIN LISPRO (HUMAN) 100 UNIT/ML IJ/SC SOLN *WRAPPED*
0.0000 [IU] | Freq: Three times a day (TID) | SUBCUTANEOUS | Status: DC
Start: 2021-04-13 — End: 2021-04-18
  Filled 2021-04-13: qty 0.04
  Filled 2021-04-13 (×3): qty 0.01

## 2021-04-13 MED ORDER — SODIUM CHLORIDE 0.9 % 100 ML IV SOLN WRAPPED *I*
2000.0000 mg | Freq: Three times a day (TID) | INTRAMUSCULAR | Status: DC
Start: 2021-04-13 — End: 2021-04-18
  Administered 2021-04-13 – 2021-04-17 (×14): 2000 mg via INTRAVENOUS
  Filled 2021-04-13 (×14): qty 20

## 2021-04-13 MED ORDER — PRAMIPEXOLE DIHYDROCHLORIDE 1 MG PO TABS *I*
1.0000 mg | ORAL_TABLET | Freq: Three times a day (TID) | ORAL | Status: DC
Start: 2021-04-13 — End: 2021-04-18
  Administered 2021-04-13 – 2021-04-17 (×15): 1 mg via ORAL
  Filled 2021-04-13 (×22): qty 1

## 2021-04-13 MED ORDER — SODIUM CHLORIDE 0.9 % 100 ML IV SOLN WRAPPED *I*
1000.0000 mg | Freq: Two times a day (BID) | INTRAMUSCULAR | Status: DC
Start: 2021-04-13 — End: 2021-04-13

## 2021-04-13 MED ORDER — SODIUM CHLORIDE 0.9 % IV SOLN WRAPPED *I*
125.0000 mL/h | Status: DC
Start: 2021-04-13 — End: 2021-04-13
  Administered 2021-04-13 (×2): 125 mL/h via INTRAVENOUS

## 2021-04-13 MED ORDER — SODIUM CHLORIDE 0.9 % 100 ML IV SOLN WRAPPED *I*
2000.0000 mg | Freq: Once | Status: AC
Start: 2021-04-13 — End: 2021-04-13
  Administered 2021-04-13: 2000 mg via INTRAVENOUS
  Filled 2021-04-13: qty 20

## 2021-04-13 MED ORDER — ATORVASTATIN CALCIUM 20 MG PO TABS *I*
10.0000 mg | ORAL_TABLET | Freq: Every day | ORAL | Status: DC
Start: 2021-04-13 — End: 2021-04-18
  Administered 2021-04-13 – 2021-04-17 (×5): 10 mg via ORAL
  Filled 2021-04-13 (×5): qty 1

## 2021-04-13 MED ORDER — VANCOMYCIN HCL 2000 MG/400ML IV SOLN (ROOM TEMPERATURE) *I*
2000.0000 mg | Freq: Once | INTRAVENOUS | Status: AC
Start: 2021-04-13 — End: 2021-04-13
  Administered 2021-04-13: 2000 mg via INTRAVENOUS
  Filled 2021-04-13: qty 400

## 2021-04-13 MED ORDER — LACTATED RINGERS IV BOLUS *I*
1500.0000 mL | Freq: Once | INTRAVENOUS | Status: AC
Start: 2021-04-13 — End: 2021-04-13
  Administered 2021-04-13: 1500 mL via INTRAVENOUS
  Filled 2021-04-13: qty 1500

## 2021-04-13 MED ORDER — FLUOXETINE HCL 10 MG PO CAPS *I*
10.0000 mg | ORAL_CAPSULE | Freq: Every day | ORAL | Status: DC
Start: 2021-04-13 — End: 2021-04-18
  Administered 2021-04-13 – 2021-04-17 (×5): 10 mg via ORAL
  Filled 2021-04-13 (×5): qty 1

## 2021-04-13 MED ORDER — CYCLOBENZAPRINE HCL 10 MG PO TABS *I*
5.0000 mg | ORAL_TABLET | Freq: Once | ORAL | Status: AC
Start: 2021-04-13 — End: 2021-04-13
  Administered 2021-04-13: 5 mg via ORAL
  Filled 2021-04-13: qty 1

## 2021-04-13 MED ORDER — GLUCAGON HCL (RDNA) 1 MG IJ SOLR *WRAPPED*
1.0000 mg | INTRAMUSCULAR | Status: DC | PRN
Start: 2021-04-13 — End: 2021-04-18

## 2021-04-13 NOTE — Progress Notes (Signed)
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 Today is day 1 of therapy.    Goal vancomycin trough is 15-20 mcg/mL.    Laboratory Data      Lab results: 04/13/21  0050 04/05/21  1734 03/25/21  0546   WBC 13.1* 6.9 6.4         Lab results: 04/13/21  0050 04/05/21  1734 03/25/21  0546   Creatinine 1.24* 0.78 0.76         Lab results: 04/13/21  0050 04/05/21  1734 03/25/21  0546   UN 22* 16 9     Vancomycin concentrations:       Lab results: 03/25/21  1332   Vancomycin Trough 19.9       Assessment and Plan   Considering patient's renal function, volume status, clinical status, and reported concentrations, 1750 mg Q12H will obtain goal vancomycin concentration unless renal function or clinical status changes.    Next vancomycin concentration before the 4th or 5th dose, order placed to be drawn on 04/14/21 at 1300.    Pharmacist will follow for changes in renal function, toxicity, and efficacy and order serum concentrations and creatinine as needed.      Esther Hardy, PharmD

## 2021-04-13 NOTE — H&P (Signed)
[facility] Internal Medicine History and Physical Note    Date seen: 04/13/21    CC: Intractible Left hip/back pain    HPI: Craig Gilbert is a very pleasant 65 y.o. male who presented to the ED with a CC of inability to ambulate 2/2 severe left hip pain and recurrence of his sciatic nerve pain. He was recently admitted to [facility] after having a septic left hip which needed debridement. Culture was sent to [facility] for further pathology, awaiting results to determine if he will need further abx at home.  He was discharged home with PICC line in place in the right arm. He also has a foley catheter for urinary retention which was changed (04/12/2021). He states that since he was discharged from [facility] and has been able to ambulate with a cane until today when his pain became unbearable. He was diaphoretic during evaluation and appeared to be in a significant amount of distress. Patient received dilaudid in the ED for pain and was started on Cefepime and Vancomycin. Lactic acid was elevated 7.6. Postoperative wound left hip appears clean, dry, no erythema or drainage noted, staples in place.  Patient is being admitted for severe sepsis (lactic acidosis, source left hip) secondary to septic left hip.          ROS : Review of Systems   Constitutional: Positive for fever.   Musculoskeletal: Positive for back pain (sciatica) and joint pain (left hip ).   All other systems reviewed and are negative.        Past Medical History:  Past Medical History:   Diagnosis Date    Anxiety     Arthritis     DM (diabetes mellitus)     High cholesterol     HTN (hypertension)     Hydronephrosis     Nerve pain     PE (pulmonary thromboembolism)     Restless leg syndrome     Sciatica     Urethral stricture     Urinary retention        Past Surgical History:  Past Surgical History:   Procedure Laterality Date    PR CYSTOURETHROSCOPY N/A 01/07/2021    Procedure: urethroscopy;  Surgeon: Lowella Dandy, MD;  Location: [facility] MAIN OR;  Service: Urology    PR CYSTOURETHROSCOPY N/A 03/14/2021    Procedure: CYSTOSCOPY with  internal uretheromy;  Surgeon: Lowella Dandy, MD;  Location: [facility] MAIN OR;  Service: Urology    TONSILLECTOMY AND ADENOIDECTOMY  1969       No Known Allergies (drug, envir, food or latex)    Social Hx:  Social History     Socioeconomic History    Marital status: Married     Spouse name: Not on file    Number of children: Not on file    Years of education: Not on file    Highest education level: Not on file   Occupational History    Not on file   Tobacco Use    Smoking status: Former Smoker     Years: 57.00     Types: Cigarettes     Start date: 1964    Smokeless tobacco: Never Used   Substance and Sexual Activity    Alcohol use: Not Currently    Drug use: Yes     Types: Marijuana    Sexual activity: Not on file   Social History Narrative    Not on file  Family Hx:   History reviewed. No pertinent family history.    Labs  Medications Prior to Admission   Medication Sig    oxyCODONE (ROXICODONE) 10 mg immediate release tablet Take 10 mg by mouth 4 times daily as needed    doxycycline monohydrate (MONODOX) 100 mg capsule Take 100 mg by mouth 2 times daily    cefdinir (OMNICEF) 300 mg capsule Take 300 mg by mouth 2 times daily    phenazopyridine (PYRIDIUM) 100 MG tablet Take 1 tablet (100 mg total) by mouth 3 times daily as needed for Pain (pain/burning from catheter or in bladder)    tamsulosin (FLOMAX) 0.4 mg capsule Take 1 capsule (0.4 mg total) by mouth every evening  for Urinary Tract Stones    FLUoxetine (PROZAC) 10 mg capsule Take 1 capsule (10 mg total) by mouth daily    clonazePAM (KLONOPIN) 1 mg tablet Take 1 tablet (1 mg total) by mouth 2 times daily  Max daily dose: 2 mg    methylPREDNISolone (MEDROL PAK) 4 MG tablet pack Take according to package directions (6 day supply) (Patient not taking: Reported on 03/07/2021)    glimepiride (AMARYL) 4 mg tablet Take 1  tablet (4 mg total) by mouth every morning    lisinopril (PRINIVIL,ZESTRIL) 5 mg tablet TAKE 1 TABLET BY MOUTH DAILY    pramipexole (MIRAPEX) 1 MG tablet Take 1 tablet (1 mg total) by mouth 3 times daily    gabapentin (NEURONTIN) 300 mg capsule Take 1 capsule (300 mg total) by mouth nightly  for Neuropathic Pain    apixaban (ELIQUIS) 2.5 mg tablet Take 1 tablet (2.5 mg total) by mouth every 12 hours    atorvastatin (LIPITOR) 10 mg tablet Take 10 mg by mouth daily       Current Facility-Administered Medications   Medication Dose Route Frequency    atorvastatin (LIPITOR) tablet 10 mg  10 mg Oral Daily    glimepiride (AMARYL) tablet 4 mg  4 mg Oral Daily with breakfast    phenazopyridine (PYRIDIUM) tablet 100 mg  100 mg Oral TID PRN    pramipexole (MIRAPEX) tablet 1 mg  1 mg Oral TID    tamsulosin (FLOMAX) 24 hr capsule 0.4 mg  0.4 mg Oral QPM    sodium chloride 0.9 % IV  125 mL/hr Intravenous Continuous    Vancomycin - Pharmacist to Dose (admitted patients only)   Intravenous Pharmacist Managed Medication    FLUoxetine (PROzac) capsule 10 mg  10 mg Oral Daily    clonazePAM (KlonoPIN) tablet 1 mg  1 mg Oral 2 times per day    gabapentin (NEURONTIN) capsule 300 mg  300 mg Oral Nightly    ceFEPIme (MAXIPIME) 2,000 mg in sodium chloride 0.9% 100 mL IVPB  2,000 mg Intravenous Q8H    Vancomycin HCl (VANOCIN) 1750 MG/350ML infusion 1,750 mg  1,750 mg Intravenous Q12H       Lab Results:   All labs in the last 24 hours:   Recent Results (from the past 24 hour(s))   CBC and differential    Collection Time: 04/13/21 12:50 AM   Result Value Ref Range    WBC 13.1 (H) 4.2 - 9.1 THOU/uL    RBC 4.2 (L) 4.6 - 6.1 MIL/uL    Hemoglobin 12.7 (L) 13.7 - 17.5 g/dL    Hematocrit 37 (L) 40 - 51 %    MCV 87 79 - 92 fL    MCH 30 26 - 32 pg    MCHC 35  32 - 37 g/dL    RDW 12.6 11.6 - 14.4 %    Platelets 237 150 - 330 THOU/uL    Seg Neut % 84.9 %    Lymphocyte % 6.5 %    Monocyte % 7.0 %    Eosinophil % 0.5 %    Basophil % 0.3 %     Neut # K/uL 11.2 (H) 1.8 - 5.4 THOU/uL    Lymph # K/uL 0.9 (L) 1.3 - 3.6 THOU/uL    Mono # K/uL 0.9 (H) 0.3 - 0.8 THOU/uL    Eos # K/uL 0.1 0.0 - 0.5 THOU/uL    Baso # K/uL 0.0 0.0 - 0.1 THOU/uL    Nucl RBC % 0.0 0.0 - 0.2 /100 WBC    Nucl RBC # K/uL 0.0 0.0 - 0.0 THOU/uL    IMM Granulocytes # 0.1 (H) 0.0 - 0.0 THOU/uL    IMM Granulocytes 0.8 %   Basic metabolic panel    Collection Time: 04/13/21 12:50 AM   Result Value Ref Range    Glucose 275 (H) 60 - 99 mg/dL    Sodium 136 133 - 145 mmol/L    Potassium 4.3 3.4 - 4.7 mmol/L    Chloride 99 96 - 108 mmol/L    CO2 15 (L) 20 - 28 mmol/L    Anion Gap 22 (H) 7 - 16    UN 22 (H) 6 - 20 mg/dL    Creatinine 1.24 (H) 0.67 - 1.17 mg/dL    eGFR BY CREAT 65 *    Calcium 9.3 8.6 - 10.2 mg/dL   Lactate, plasma    Collection Time: 04/13/21 12:50 AM   Result Value Ref Range    Lactate 7.6 (HH) 0.5 - 2.2 mmol/L   C reactive protein    Collection Time: 04/13/21 12:50 AM   Result Value Ref Range    CRP 153 (H) 0 - 8 mg/L   COVID-19 PCR    Collection Time: 04/13/21 12:52 AM   Result Value Ref Range    COVID-19 Source Nasopharyngeal     COVID-19 PCR NEGATIVE NEG   Performing lab    Collection Time: 04/13/21 12:52 AM   Result Value Ref Range    Performing Lab see below    Urinalysis with Microscopic UA    Collection Time: 04/13/21 12:57 AM   Result Value Ref Range    Color, UA Orange Yellow-Dk Yellow    Appearance,UR Clear Clear    Specific Gravity,UA 1.025 1.002 - 1.030    Leuk Esterase,UA NEG NEGATIVE    Nitrite,UA POS (!) NEGATIVE    pH,UA 5.0 5.0 - 8.0    Protein,UA 1+ (!) NEGATIVE    Glucose,UA Trace (!)     Ketones, UA Trace NEGATIVE    Blood,UA Trace (!) NEGATIVE    RBC,UA 0-2 0 - 2 /hpf    WBC,UA None Seen 0 - 5 /hpf   DIC Profile    Collection Time: 04/13/21  3:01 AM   Result Value Ref Range    Platelets 230 150 - 330 THOU/uL    Hematocrit 34 (L) 40 - 51 %    Hemoglobin 11.9 (L) 13.7 - 17.5 g/dL   MCHC    Collection Time: 04/13/21  3:01 AM   Result Value Ref Range    MCHC  35 32 - 37 g/dL   Lactate, plasma    Collection Time: 04/13/21  3:32 AM   Result Value Ref Range    Lactate 1.2  0.5 - 2.2 mmol/L   Basic metabolic panel    Collection Time: 04/13/21  5:39 AM   Result Value Ref Range    Glucose 171 (H) 60 - 99 mg/dL    Sodium 134 133 - 145 mmol/L    Potassium 4.2 3.4 - 4.7 mmol/L    Chloride 101 96 - 108 mmol/L    CO2 21 20 - 28 mmol/L    Anion Gap 12 7 - 16    UN 20 6 - 20 mg/dL    Creatinine 0.90 0.67 - 1.17 mg/dL    eGFR BY CREAT 96 *    Calcium 8.5 (L) 8.6 - 10.2 mg/dL   CBC    Collection Time: 04/13/21  5:39 AM   Result Value Ref Range    WBC 8.6 4.2 - 9.1 THOU/uL    RBC 3.7 (L) 4.6 - 6.1 MIL/uL    Hemoglobin 11.2 (L) 13.7 - 17.5 g/dL    Hematocrit 32 (L) 40 - 51 %    MCV 87 79 - 92 fL    MCH 30 26 - 32 pg    MCHC 35 32 - 37 g/dL    RDW 12.4 11.6 - 14.4 %    Platelets 213 150 - 330 THOU/uL       Radiology impressions (last 3 days):  No results found.    Vital Signs:  BP (!) 126/92 (BP Location: Left arm)    Pulse 101    Temp 36.8 C (98.2 F) (Oral)    Resp 14    Wt 121.5 kg (267 lb 13.7 oz)    SpO2 92%    BMI 34.39 kg/m     Physical Exam by Systems:  Physical Exam  Constitutional:       General: He is in acute distress (pain).      Appearance: Normal appearance.   HENT:      Head: Normocephalic and atraumatic.      Right Ear: External ear normal.      Left Ear: External ear normal.      Nose: Nose normal.      Mouth/Throat:      Mouth: Mucous membranes are moist.   Eyes:      Extraocular Movements: Extraocular movements intact.      Pupils: Pupils are equal, round, and reactive to light.   Cardiovascular:      Rate and Rhythm: Normal rate and regular rhythm.      Pulses: Normal pulses.      Heart sounds: Normal heart sounds.   Pulmonary:      Effort: Pulmonary effort is normal.      Breath sounds: Normal breath sounds.   Abdominal:      General: Bowel sounds are normal.      Palpations: Abdomen is soft.   Musculoskeletal:         General: Normal range of motion.      Cervical  back: Normal range of motion and neck supple.      Comments: Right hip postop wound clean, dry, no erythema, staples in place.    Skin:     General: Skin is warm and dry.      Capillary Refill: Capillary refill takes less than 2 seconds.   Neurological:      General: No focal deficit present.      Mental Status: He is alert and oriented to person, place, and time.   Psychiatric:         Mood and  Affect: Mood normal.           Hospital Problems:   Active Hospital Problems    Diagnosis     Sepsis, due to unspecified organism, unspecified whether acute organ dysfunction present        Plan and Assessment    Left hip septic hip  Severe sepsis 2/2 septic left hip (source left hip, lactic acidosis of 7.6)  Sciatic nerve pain  - Wound clean, dry, no discharge or erythema. Staples in place  - Start Cefepime/Vanco  - Patient has been accepted at [facility]. Hospitalist Dr. Wynona Meals to be notified if condition worsens.  - IV Fluids NS@ 144ms/hr  -Dilaudid    Hematemesis 2/2 anticoagulant use  -hold Xarelto  -IV protonix    Restless legs syndrome  -Continue mirapex  -Continue gabapentin    Urinary retention  -Chronic foley in place changed [facility]    ---Hospital Bundle---    Fluids & Electrolytes: IVF@ 125 mls/hr    Nutrition: Regular    Mobility Plan/Ability: As tolerated, uses cane for mobility     List of antimicrobials: Vanc/Cefepime    DVT prophylaxis: Held for hematemesis    Indication for gastric acid suppression: GI Bleed    Family updates/concerns: None    Code Status: Full code    Disposition: Admit to ICU    Author: NLisbeth Ply PA  as of: 04/13/2021  at: 7:43 AM

## 2021-04-13 NOTE — ED Provider Notes (Signed)
History     Chief Complaint   Patient presents with    Hip Pain     [facility] is a 65 y.o. 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  He was discharged home with PICC line in place in the right arm.  Patient presents to the emergency department tonight in acute distress, unable to get out of his vehicle due to extreme pain in his left hip.  Staff met the patient at his vehicle where he was noted to be profoundly diaphoretic, ashen in color, and had to be assisted and lifted out of his vehicle onto the stretcher.  Patient writhing in pain and having difficulty answering questions due to his extreme distress.  He states he has been sweating all day and is unsure if he has been running a fever.  Patient states he has been taking his OxyContin as prescribed 4 times daily and tonight it was not helping with his pain at all and his pain is 10 out of 10 in the left hip.  Patient states he has not been able to put any pressure on his left leg for at least the last 12 to 14 hours.  Patient denies any drainage from the surgical site.  Patient denies any difficulty with draining of his Foley catheter and states that the urine has been dark orange secondary to the Pyridium he is on for urinary pain and has not changed color in the last few days.  He states he has had less urine output than normal over the last 24 hours.  Patient  is any difficulty breathing [facility] chest pain.          Medical/Surgical/Family History     Past Medical History:   Diagnosis Date    Anxiety     Arthritis     DM (diabetes mellitus)     High cholesterol     HTN (hypertension)     Hydronephrosis     Nerve pain     PE (pulmonary thromboembolism)     Restless leg syndrome     Sciatica     Urethral stricture     Urinary retention         Patient Active Problem List   Diagnosis Code    Leg abscess L02.419    Sepsis, due to unspecified organism, unspecified whether acute organ dysfunction present A41.9            Past Surgical History:   Procedure Laterality Date    PR CYSTOURETHROSCOPY N/A 01/07/2021    Procedure: urethroscopy;  Surgeon: Lowella Dandy, MD;  Location: [facility];  Service: Urology    PR CYSTOURETHROSCOPY N/A 03/14/2021    Procedure: CYSTOSCOPY with  internal uretheromy;  Surgeon: Lowella Dandy, MD;  Location: [facility];  Service: Urology    [facility]     History reviewed. No pertinent family history.  Social History     Tobacco Use    Smoking status: Former Smoker     Years: 57.00     Types: Cigarettes     Start date: 1962    Smokeless tobacco: Never Used   Substance Use Topics    Alcohol use: Not Currently    Drug use: Yes     Types: Marijuana     Living Situation     Questions Responses    Patient lives with     Homeless     Caregiver for other family member     External Services     Employment     Domestic Violence Risk                 Review of Systems   Review of Systems   Constitutional: Positive for chills and diaphoresis. Negative for fatigue and fever.   HENT: Negative for congestion, hearing loss, sinus pressure and sinus pain.    Respiratory: Negative for cough and shortness of breath.    Cardiovascular: Negative for chest pain and palpitations.   Gastrointestinal: Positive for nausea. Negative for abdominal pain, diarrhea and vomiting.   Genitourinary: Positive for decreased urine volume.  Negative for difficulty urinating, dysuria and frequency.   Musculoskeletal: Positive for gait problem (Severe left hip pain, unable to ambulate). Negative for back pain and neck pain.   Skin: Positive for pallor. Negative for color change, rash and wound.   Neurological: Positive for light-headedness. Negative for dizziness, seizures, syncope and headaches.   Psychiatric/Behavioral: Negative for agitation and confusion. The patient is nervous/anxious.        Physical Exam     Triage Vitals  Triage Start: Start, (04/13/21 0038)   First Recorded BP: 128/78, Resp: 22, Temp: 37.3 C (99.1 F), Temp src: Oral Oxygen Therapy SpO2: 97 %, Oximetry Source: Rt Hand, O2 Device: None (Room air), Heart Rate: 108, (04/13/21 0043) Heart Rate (via Pulse Ox): 108, (04/13/21 0047).  First Pain Reported  0-10 Scale: 10, Pain Location/Orientation: Hip Left, Pain Descriptors: Hervey Ard, (04/13/21 0043)       Physical Exam  Vitals and nursing note reviewed.   Constitutional:       General: He is in acute distress.      Appearance: He is well-developed. He is ill-appearing, toxic-appearing and diaphoretic.      Comments: Patient is conscious and alert, profoundly diaphoretic and ashen in color   HENT:      Head: Normocephalic and atraumatic.      Right Ear: External ear normal.      Left Ear: External ear normal.      Nose: Nose normal. No congestion.      Mouth/Throat:      Mouth: Mucous membranes are dry.      Pharynx: No posterior oropharyngeal erythema.   Eyes:      General: No scleral icterus.        Right eye: No discharge.         Left eye: No discharge.      Conjunctiva/sclera: Conjunctivae normal.      Pupils: Pupils are equal, round, and reactive to light.   Neck:      Vascular: No JVD.      Trachea: No tracheal deviation.   Cardiovascular:      Rate and Rhythm: Normal rate and regular rhythm.      Heart sounds: Normal heart sounds. No murmur heard.    No friction rub. No gallop.  Pulmonary:      Effort: Pulmonary effort is  normal. No respiratory distress.      Breath sounds: Normal breath sounds. No wheezing Gilbert rales.   Chest:      Chest wall: No tenderness.   Abdominal:      General: Bowel sounds are normal. There is no distension.      Palpations: Abdomen is soft.      Tenderness: There is no abdominal tenderness.   Genitourinary:     Comments: Foley in place with dark orange urine in the bag  Musculoskeletal:         General: No deformity.      Cervical back: Normal range of motion and neck supple.      Right hip: Normal. No tenderness. Normal range of motion.      Left hip: Tenderness present. Decreased range of motion.        Legs:    Skin:     General: Skin is cool.      Capillary Refill: Capillary refill takes 2 to 3 seconds.      Coloration: Skin is ashen and pale.      Findings: No erythema Gilbert rash.   Neurological:      General: No focal deficit present.      Mental Status: He is alert and oriented to person, place, and time.      GCS: GCS eye subscore is 4. GCS verbal subscore is 5. GCS motor subscore is 6.      Motor: No abnormal muscle tone.   Psychiatric:         Mood and Affect: Mood is anxious.         Behavior: Behavior is agitated. Behavior is cooperative.         Thought Content: Thought content normal.         Judgment: Judgment normal.         Medical Decision Making   Patient seen by me on:  04/13/2021    Assessment:  [facility] is a 65 y.o. male, presents to the emergency department in extremis with severe pain, profoundly diaphoretic and had to be physically lifted and removed from his vehicle because he was unable to get out of the truck.  He was brought into the ED where he did have stable blood pressure, but appears ill and toxic.  Concern for recurrent sepsis.  Will assess labs including blood cultures, start IV fluid resuscitation and attempt to manage patient's pain with IV opiate therapy.    Differential diagnosis:  Recurrent left hip infection, recurrent bacteremia, sepsis, intractable pain, concern  for development of septic shock, recurrent upper GI bleed -secondary to severe illness, possible acute anemia, likely dehydration, possible acute kidney injury, possible severe electrolyte disturbances    Plan:  Orders Placed This Encounter      Blood culture      Blood culture      Bacterial urine culture      CBC and differential      Basic metabolic panel      Lactate, plasma      Lactate, plasma (CONDITIONAL)      COVID-19 PCR      Urinalysis with Microscopic UA      C reactive protein      Sedimentation rate, automated      DIC Profile      MCHC      Protime-INR      APTT  Performing lab      Lactate, plasma      Vital signs      Insert peripheral IV      Admit to Hospital Inpatient      Medications  lactated ringers bolus 1,500 mL (1,500 mLs Intravenous New Bag 04/13/21 0223)  HYDROmorphone PF (DILAUDID) injection 1 mg (1 mg Intravenous Given 04/13/21 0125)  ondansetron (ZOFRAN) injection 4 mg (4 mg Intravenous Given 04/13/21 0125)  sodium chloride 0.9 % bolus 1,000 mL (1,000 mLs Intravenous New Bag 04/13/21 0125)  Vancomycin HCl (VANOCIN) 2000 MG/400ML infusion 2,000 mg (2,000 mg Intravenous Given 04/13/21 0150)  ceFEPIme (MAXIPIME) 2,000 mg in sodium chloride 0.9% 100 mL IVPB (0 mg Intravenous Stopped 04/13/21 0223)  LORazepam (ATIVAN) tablet 2 mg (2 mg Oral Given 04/13/21 0145)  HYDROmorphone PF (DILAUDID) injection 2 mg (2 mg Intravenous Given 04/13/21 0146)  pantoprazole (PROTONIX) 4 mg/ml injection 40 mg (40 mg Intravenous Given 04/13/21 0222)  promethazine (PHENERGAN) 25 mg in sodium chloride 0.9% 25 mL IVPB (25 mg Intravenous New Bag 04/13/21 0222)      Independent review of: Existing labs, chart/prior records         ED Course as of 04/13/21 0641   Sun Apr 13, 2021   0117 WBC(!): 13.1   0117 Hemoglobin(!): 12.7   0117 Hematocrit(!): 37   0117 Seg Neut %: 84.9   0117 Neut # K/uL(!): 11.2   0138 CRP(!): 153  Significantly elevated.   0138 Glucose(!): 275   0138 Sodium: 136   0138 Potassium: 4.3   0138  Chloride: 99   0138 CO2(!): 15   0138 Anion Gap(!): 22   0138 UN(!): 22   0138 Creatinine(!): 1.24  Acute kidney injury   0145 Lactate(!!): 7.6   0147 Patient with likely recurrent sepsis, likely related to his left hip and possible recurrent septic arthritis.  His lactate is 7.6 and CRP is 153.  He previously had pseudomonas bacteremia, so I am broadly covering with cefepime and vancomycin to cover for possible MRSA given recent hip washout.  He continues to have severe pain 10/10 after 1 mg IV dilaudid and is extremely anxious and will give PO ativan.  I am reaching out to the transfer center at [facility] in [place] as that is where his hip surgery was recently done.   1027 I have called and spoken with Dr. Margart Sickles, who is the patient's orthopedic surgeon, through the transfer center, and he has accepted patient for transfer to [facility], to be admitted to the hospitalist service there once a bed is available, but at this time they have no beds available in the hospital [facility] the ED and are unable to give a timeline as to when a bed will be available.        Fax [phone] to fax face sheet and covid results when available   0207 I was just called to the bedside and patient had vomited, what appears to be hematemesis.  Will start on pantoprazole for GI prophylaxis.   7425 I have added on Coags and DIC panel given concern for GI bleed.  He had a GI bleed when he was previously admitted to the ICU here last month as well.     0232 I spoke with the hospital medicine team and discussed the case with them and they are seeing the patient for possible admission here.   0238 The hospital medicine team is going to speak with the hospital medicine  attending to discuss the case, given how sick the patient is.  Unfortunately, it would be very difficult to get the patient transferred to another hospital, given that he was surgically managed with a hip washout just 2 weeks ago at [facility].     0244 Patient has been  accepted here for admission to the ICU after speaking with the hospital medicine team.     0245 HM team will follow up on the remainder of the pending results.   0319 Dr. Wynona Meals from [facility] team called to get report on patient.  I gave her full report and she asks that we keep them updated with repeat lactate, any repeat hematemesis, Gilbert concern for worsening condition because they state if he will meet their ICU criteria they may have some movement in beds and may be able to get him transferred sooner.  I have updated the hospital medicine team.  Repeat lactate is being sent at this time and HM team will follow up on results.    6578 COVID-19 PCR: NEGATIVE       Kathrin Penner, MD        Critical Care    There is a high probability of imminent Gilbert life threatening deterioration due to sepsis and dehydration (concern for development of septic shock, acute GI bleed in the setting of sepsis).    Acute interventions include >2L of IVF, develop tx plan w/pt [facility] surrogate, discussions w/other provider, documenting the case, eval pt's response to tx, initial hx & physical exam, IV meds (specify below), obtaining hx from pt [facility] surrogate, order & perform tx & interventions, order & review of lab studies, re-eval of pt's condition and review of old charts.    IV medications given:  Vancomycin, cefepime, dilaudid repeated doses, NS, LR    I personally spent 67 cumulative minutes performing critical care interventions to this patient as outlined above. This excludes separately billable procedures.         Author:  Kathrin Penner, MD       Kathrin Penner, MD  04/13/21 [phone]

## 2021-04-13 NOTE — ED Notes (Signed)
Report given to rn icu md orders status reviewed and noted.

## 2021-04-13 NOTE — ED Triage Notes (Signed)
Presented to er with uncontrolled pain to left and left inner thigh with sciatic to left side.

## 2021-04-13 NOTE — Continuity of Care (Signed)
Attempted to discuss discharge planning with Francee Piccolo, he requested this Probation officer return later. Case Management continues to follow.

## 2021-04-14 ENCOUNTER — Inpatient Hospital Stay: Payer: Medicaid Other

## 2021-04-14 DIAGNOSIS — M84452A Pathological fracture, left femur, initial encounter for fracture: Secondary | ICD-10-CM

## 2021-04-14 LAB — CBC AND DIFFERENTIAL
Baso # K/uL: 0 10*3/uL (ref 0.0–0.1)
Basophil %: 0.7 %
Eos # K/uL: 0.3 10*3/uL (ref 0.0–0.5)
Eosinophil %: 5 %
Hematocrit: 30 % — ABNORMAL LOW (ref 40–51)
Hemoglobin: 10.2 g/dL — ABNORMAL LOW (ref 13.7–17.5)
IMM Granulocytes #: 0 10*3/uL (ref 0.0–0.0)
IMM Granulocytes: 0.4 %
Lymph # K/uL: 1 10*3/uL — ABNORMAL LOW (ref 1.3–3.6)
Lymphocyte %: 17 %
MCH: 30 pg (ref 26–32)
MCHC: 34 g/dL (ref 32–37)
MCV: 89 fL (ref 79–92)
Mono # K/uL: 0.7 10*3/uL (ref 0.3–0.8)
Monocyte %: 11.6 %
Neut # K/uL: 3.6 10*3/uL (ref 1.8–5.4)
Nucl RBC # K/uL: 0 10*3/uL (ref 0.0–0.0)
Nucl RBC %: 0 /100 WBC (ref 0.0–0.2)
Platelets: 201 10*3/uL (ref 150–330)
RBC: 3.4 MIL/uL — ABNORMAL LOW (ref 4.6–6.1)
RDW: 12.4 % (ref 11.6–14.4)
Seg Neut %: 65.3 %
WBC: 5.6 10*3/uL (ref 4.2–9.1)

## 2021-04-14 LAB — COMPREHENSIVE METABOLIC PANEL
ALT: 12 U/L (ref 0–50)
AST: 19 U/L (ref 0–50)
Albumin: 3 g/dL — ABNORMAL LOW (ref 3.5–5.2)
Alk Phos: 102 U/L (ref 40–130)
Anion Gap: 9 (ref 7–16)
Bilirubin,Total: 0.4 mg/dL (ref 0.0–1.2)
CO2: 22 mmol/L (ref 20–28)
Calcium: 8.5 mg/dL — ABNORMAL LOW (ref 8.6–10.2)
Chloride: 105 mmol/L (ref 96–108)
Creatinine: 0.87 mg/dL (ref 0.67–1.17)
Glucose: 109 mg/dL — ABNORMAL HIGH (ref 60–99)
Lab: 15 mg/dL (ref 6–20)
Potassium: 3.6 mmol/L (ref 3.4–4.7)
Sodium: 136 mmol/L (ref 133–145)
Total Protein: 6 g/dL — ABNORMAL LOW (ref 6.3–7.7)
eGFR BY CREAT: 97 *

## 2021-04-14 LAB — POCT GLUCOSE
Glucose POCT: 164 mg/dL — ABNORMAL HIGH (ref 60–99)
Glucose POCT: 146 mg/dL — ABNORMAL HIGH (ref 60–99)
Glucose POCT: 108 mg/dL — ABNORMAL HIGH (ref 60–99)

## 2021-04-14 LAB — VANCOMYCIN, TROUGH: Vancomycin Trough: 14.4 ug/mL (ref 10.0–20.0)

## 2021-04-14 LAB — MRSA NAAT (PCR): MRSA (ORSA) Amplification: 0

## 2021-04-14 LAB — AEROBIC CULTURE: Aerobic Culture: 0

## 2021-04-14 MED ORDER — VANCOMYCIN HCL 2000 MG/400ML IV SOLN (ROOM TEMPERATURE) *I*
2000.0000 mg | Freq: Two times a day (BID) | INTRAVENOUS | Status: DC
Start: 2021-04-15 — End: 2021-04-18
  Administered 2021-04-15 – 2021-04-17 (×6): 2000 mg via INTRAVENOUS
  Filled 2021-04-14 (×6): qty 400

## 2021-04-14 MED ORDER — IOHEXOL 350 MG/ML (OMNIPAQUE) IV SOLN 500ML BOTTLE *I*
1.0000 mL | Freq: Once | INTRAVENOUS | Status: AC
Start: 2021-04-14 — End: 2021-04-14
  Administered 2021-04-14: 140 mL via INTRAVENOUS

## 2021-04-14 MED ORDER — KETOROLAC TROMETHAMINE 30 MG/ML IJ SOLN *I*
15.0000 mg | Freq: Four times a day (QID) | INTRAMUSCULAR | Status: DC | PRN
Start: 2021-04-14 — End: 2021-04-18
  Administered 2021-04-14 – 2021-04-17 (×11): 15 mg via INTRAVENOUS
  Filled 2021-04-14 (×11): qty 1

## 2021-04-14 MED ORDER — HYDROXYZINE PAMOATE 50 MG PO CAPS *I*
50.0000 mg | ORAL_CAPSULE | Freq: Once | ORAL | Status: AC | PRN
Start: 2021-04-14 — End: 2021-04-14
  Administered 2021-04-14: 50 mg via ORAL
  Filled 2021-04-14: qty 1

## 2021-04-14 NOTE — Progress Notes (Signed)
[facility] Progress Note      SUBJECTIVE:   The patient was evaluated at bedside, patient reports continued pain at the site of the left hip, denies any further N/V or hemetemesis     Active Hospital Medications:   Scheduled Meds  [START ON 04/15/2021] vancomycin  2,000 mg Intravenous Q12H    atorvastatin  10 mg Oral Daily    glimepiride  4 mg Oral Daily with breakfast    pramipexole  1 mg Oral TID    tamsulosin  0.4 mg Oral QPM    FLUoxetine  10 mg Oral Daily    clonazePAM  1 mg Oral 2 times per day    gabapentin  300 mg Oral Nightly    ceFEPime (MAXIPIME) IV  2,000 mg Intravenous Q8H    pantoprazole  40 mg Intravenous Q24H    enoxaparin  1 mg/kg Subcutaneous Q12H    insulin lispro  0-4 units Subcutaneous TID WC    insulin glargine  20 units Subcutaneous Nightly      Continuous Infusions  Vancomycin - Pharmacist to Dose        PRN Meds ketorolac, phenazopyridine, HYDROmorphone hcl, oxyCODONE, naloxone, Nursing communication- Give 4 OZ of fruit juice for BG < 70 mg/dl **AND** dextrose **AND** dextrose **AND** glucagon **AND** POCT glucose      OBJECTIVE:    Current Vitals: BP 132/85    Pulse 88    Temp 36.7 C (98.1 F) (Oral)    Resp (!) 25    Ht 1.88 m (6' 2.02")    Wt 122.5 kg (270 lb 1 oz)    SpO2 95%    BMI 34.66 kg/m      Weight:   Last weight   04/14/21 122.5 kg (270 lb 1 oz)   04/05/21 123.4 kg (272 lb)        I/O:     Intake/Output Summary (Last 24 hours) at 04/14/2021 1807  Last data filed at 04/14/2021 1159  Gross per 24 hour   Intake 1881 ml   Output 1950 ml   Net -69 ml       Physical Exam:   Physical Exam  Vitals and nursing note reviewed.   Constitutional:       Appearance: Normal appearance.   HENT:      Head: Normocephalic and atraumatic.      Nose: Nose normal.      Mouth/Throat:      Mouth: Mucous membranes are moist.      Pharynx: Oropharynx is clear.   Eyes:      Extraocular Movements: Extraocular movements intact.      Pupils: Pupils are equal, round, and reactive to light.    Cardiovascular:      Rate and Rhythm: Normal rate and regular rhythm.   Pulmonary:      Effort: Pulmonary effort is normal. No respiratory distress.      Breath sounds: No wheezing or rhonchi.   Abdominal:      General: Bowel sounds are normal. There is no distension.      Palpations: Abdomen is soft.      Tenderness: There is no abdominal tenderness.   Genitourinary:     Comments: Foley  Musculoskeletal:         General: Tenderness present. No swelling.      Right lower leg: No edema.      Left lower leg: Edema present.   Skin:     General: Skin is warm and dry.  Capillary Refill: Capillary refill takes 2 to 3 seconds.   Neurological:      General: No focal deficit present.      Mental Status: He is alert and oriented to person, place, and time.   Psychiatric:         Mood and Affect: Mood normal.         Behavior: Behavior normal.           LABS   Recent Results (from the past 24 hour(s))   POCT glucose    Collection Time: 04/13/21  8:18 PM   Result Value    Glucose POCT 146 (H)   CBC and differential    Collection Time: 04/14/21  5:13 AM   Result Value    WBC 5.6    RBC 3.4 (L)    Hemoglobin 10.2 (L)    Hematocrit 30 (L)    MCV 89    MCH 30    MCHC 34    RDW 12.4    Platelets 201    Seg Neut % 65.3    Lymphocyte % 17.0    Monocyte % 11.6    Eosinophil % 5.0    Basophil % 0.7    Neut # K/uL 3.6    Lymph # K/uL 1.0 (L)    Mono # K/uL 0.7    Eos # K/uL 0.3    Baso # K/uL 0.0    Nucl RBC % 0.0    Nucl RBC # K/uL 0.0    IMM Granulocytes # 0.0    IMM Granulocytes 0.4   Comprehensive metabolic panel    Collection Time: 04/14/21  5:13 AM   Result Value    Sodium 136    Potassium 3.6    Chloride 105    CO2 22    Anion Gap 9    UN 15    Creatinine 0.87    eGFR BY CREAT 97    Glucose 109 (H)    Calcium 8.5 (L)    Total Protein 6.0 (L)    Albumin 3.0 (L)    Bilirubin,Total 0.4    AST 19    ALT 12    Alk Phos 102   POCT glucose    Collection Time: 04/14/21 11:28 AM   Result Value    Glucose POCT 164 (H)   Vancomycin,  trough    Collection Time: 04/14/21  1:16 PM   Result Value    Vancomycin Trough 14.4   POCT glucose    Collection Time: 04/14/21  5:25 PM   Result Value    Glucose POCT 146 (H)             Microbiology results   Results for orders placed or performed during the hospital encounter of 04/13/21 (from the past 2016 hour(s))   Blood culture    Collection Time: 04/13/21 12:52 AM    Specimen: Blood: aerobic/anaerobic; NO SITE PROVIDED   Result Value Ref Range    Bacterial Blood Culture .     Narrative    No growth to date   Blood culture    Collection Time: 04/13/21 12:52 AM    Specimen: Blood: aerobic/anaerobic; NO SITE PROVIDED   Result Value Ref Range    Bacterial Blood Culture .     Narrative    No growth to date   Bacterial urine culture    Collection Time: 04/13/21 12:57 AM    Specimen: Urine (Clean catch, voided, midstream)   Result Value  Ref Range    Aerobic Culture .     Narrative    No growth to date   MRSA (ORSA) amplification    Collection Time: 04/13/21  5:38 AM    Specimen: Nares   Result Value Ref Range    MRSA (ORSA) Amplification .     Narrative    Negative for MRSA Nucleic Acid  Test Method:  Nucleic Acid Amplification by PCR           Radiology results    CT hip LEFT with IV contrast    Result Date: 04/14/2021  Interval increase in size of the complex periarticular fluid collections adjacent to the proximal femur. Interval development of a subacute pathologic displaced fracture at the basicervical femoral neck with posterior displacement and varus angulation at the fracture site. At the time of surgery for the left femoral fracture consider repeating biopsies of the fluid collection and underlying bone to evaluate for infection and to exclude other etiologies. END OF IMPRESSION I have personally reviewed the images and the Resident's/Fellow's interpretation and agree with or edited the findings. [facility] submits this DICOM format image data and final report to the [facility], an independent secure  electronic health information exchange, on a reciprocally searchable basis (with patient authorization) for a minimum of 12 months after exam date.    Chest single frontal view    Result Date: 04/13/2021  No acute cardiopulmonary disease. END OF IMPRESSION I have personally reviewed the images and the Resident's/Fellow's interpretation and agree with or edited the findings. [facility] submits this DICOM format image data and final report to the [facility], an independent secure electronic health information exchange, on a reciprocally searchable basis (with patient authorization) for a minimum of 12 months after exam date.         ASSESSMENT/PLAN:       Active Problems:    Sepsis, due to unspecified organism, unspecified whether acute organ dysfunction present     Severe sepsis   Septic arthritis with infected abscess of the left native hip status post washout on 03/26/2021, clinical concern for underlying malignancy  Left Femoral Neck Fracture likely atraumatic 2/2 acute osteomyelitis   Acute ostemyelitis  -- Vanc/Cefepime  -- Await Cultures   -- Monitor Critical Is and Os   -- Awaiting Tx under Orthopedics   Hematemesis resolved  Acute blood loss anemia likely 2/2 UGIB/Dilutional   Recent left lower extremity DVT anticoagulated with Xarelto at home  -- Continue Current measures, trend CBC   -- Lovenox given Pre Operative status     Diabetes mellitus  -- Accucheck, Lantus + Low dose scale     Hyperlipidemia  -- Lipitor     History of pulmonary embolism    Anxiety, with chronic BZ dependence  Chronic pain  RLS  -- Neurontin, Mirapex, Klonopin, Prozac    Chronic urinary retention with chronic Foley catheter  -- Foley Patent     DVT Prophylaxis: Lovenox  GI Prophylaxis: PPI  Code:   Code Status Information     Code Status Advance Care Planning    Full Code Jump to the Activity         Total time spent during encounter > 35 min spent in care of this pt with >50% in counseling/coordination of care    Author: Fidela Salisbury, MD  as of: 04/14/2021  at: 6:07 PM

## 2021-04-14 NOTE — Progress Notes (Signed)
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 Today is day 2 of therapy.    Goal vancomycin trough is 15-20 mcg/mL.    Laboratory Data      Lab results: 04/14/21  0513 04/13/21  0539 04/13/21  0050   WBC 5.6 8.6 13.1*         Lab results: 04/14/21  0513 04/13/21  0539 04/13/21  0050   Creatinine 0.87 0.90 1.24*         Lab results: 04/14/21  0513 04/13/21  0539 04/13/21  0050   UN 15 20 22*     Vancomycin concentrations:       Lab results: 04/14/21  1316   Vancomycin Trough 14.4       Assessment and Plan   Considering patient's renal function, volume status, clinical status, and reported concentrations, 2000 mg Q12H will obtain goal vancomycin concentration unless renal function or clinical status changes.    Next vancomycin concentration in 24 hours, order placed to be drawn on 04/15/21 at 1300.    Pharmacist will follow for changes in renal function, toxicity, and efficacy and order serum concentrations and creatinine as needed.    Antimicrobial Stewardship Program has approved for 7 days    For questions contact pharmacy at [facility], PharmD

## 2021-04-14 NOTE — Progress Notes (Signed)
Medical Nutrition Therapy - Initial Assessment    .    Admit Date: 04/13/2021  Reason for consult: Initial Nutrition Assessment, MST score 3, RN consult.  Malnutrition Screening Tool score or >/=2 due to unplanned weight loss and/or reported eating or drinking poorly or nausea, vomiting, diarrhea for 7 days.    Patient Summary: Pt presents with sepsis in right hip recurrent from a month ago as per notes.There is indication of possible transfer to facilty of origin of hip surgery.   Past Medical History:   Diagnosis Date    Anxiety     Arthritis     DM (diabetes mellitus)     High cholesterol     HTN (hypertension)     Hydronephrosis     Nerve pain     PE (pulmonary thromboembolism)     Restless leg syndrome     Sciatica     Urethral stricture     Urinary retention      Past Surgical History:   Procedure Laterality Date    PR CYSTOURETHROSCOPY N/A 01/07/2021    Procedure: urethroscopy;  Surgeon: Lowella Dandy, MD;  Location: [facility] MAIN OR;  Service: Urology    PR CYSTOURETHROSCOPY N/A 03/14/2021    Procedure: CYSTOSCOPY with  internal uretheromy;  Surgeon: Lowella Dandy, MD;  Location: [facility] MAIN OR;  Service: Urology    TONSILLECTOMY AND ADENOIDECTOMY  1967        Pertinent Social Hx:     Pertinent Meds: reviewed   Pertinent Labs: reviewed 04/14/21: Na 136, K 3.6, Bun 15, Cr. 0.87, G 109 H, Cal. 8.5 L, Alb. 3.0 L and Hgb 10.2 L.     Reviewed I/O's  Enteral or parenteral access: None    Nutrition Hx: Per notes pt reports weight loss and recent poor intake. Recorded intake is ~ 50 % on Fat restricted and sodium restricted diet.    Food allergies: NKFA    Current diet: Fat restricted and Sodium restricted, 2-3 gm.   Supplements: None at this time.     Nutrition Focused Physical Exam:    Skin: Intact      Anthropometrics:  Height: 188 cm (6' 2.02")    Current Weight: 122.5 kg (270 lb 1 oz); 142% IBW; 95% UBW  UBW: 129.5 kg  Ideal Body Weight: 86 kg + 10%  BMI: 34.6 kg/(m^2) Obese  Weight Hx: Obesity    Estimated  Nutrient Needs: (Based on 95.5 kg)ABW    2387 kcal/day (25 kcal/kg)   95 g protein/day (1.0 g/kg)    7035-0093 mL fluid/day (25-30 mL/kg)    Nutrition Assessment and Diagnosis:   Pt with recent inadequate intake related to current illness, sepsis as evidenced by Pt reported weight loss  Pt is obese related to excessive energy intake with decreased physical activity as evidenced by BMI > 30.         Nutrition Intervention:   1. Provide as per order.   2. Nutrition staff to obtain menu preferences.     Nutrition Monitoring/Evaluation:   1. Monitor via rounds: diet tolerance and intake, nutrition-related labs, weight trend, BM pattern, need for supplement      2. Nutrition to follow up per high nutrition risk protocol.

## 2021-04-15 ENCOUNTER — Inpatient Hospital Stay: Payer: Medicaid Other

## 2021-04-15 DIAGNOSIS — L02416 Cutaneous abscess of left lower limb: Secondary | ICD-10-CM

## 2021-04-15 LAB — CBC AND DIFFERENTIAL
Baso # K/uL: 0 10*3/uL (ref 0.0–0.1)
Basophil %: 0.7 %
Eos # K/uL: 0.2 10*3/uL (ref 0.0–0.5)
Eosinophil %: 4.8 %
Hematocrit: 30 % — ABNORMAL LOW (ref 40–51)
Hemoglobin: 10.1 g/dL — ABNORMAL LOW (ref 13.7–17.5)
IMM Granulocytes #: 0 10*3/uL (ref 0.0–0.0)
IMM Granulocytes: 0.4 %
Lymph # K/uL: 0.7 10*3/uL — ABNORMAL LOW (ref 1.3–3.6)
Lymphocyte %: 15.4 %
MCH: 30 pg (ref 26–32)
MCHC: 33 g/dL (ref 32–37)
MCV: 89 fL (ref 79–92)
Mono # K/uL: 0.4 10*3/uL (ref 0.3–0.8)
Monocyte %: 9.3 %
Neut # K/uL: 3.2 10*3/uL (ref 1.8–5.4)
Nucl RBC # K/uL: 0 10*3/uL (ref 0.0–0.0)
Nucl RBC %: 0 /100 WBC (ref 0.0–0.2)
Platelets: 212 10*3/uL (ref 150–330)
RBC: 3.4 MIL/uL — ABNORMAL LOW (ref 4.6–6.1)
RDW: 12.4 % (ref 11.6–14.4)
Seg Neut %: 69.4 %
WBC: 4.5 10*3/uL (ref 4.2–9.1)

## 2021-04-15 LAB — COMPREHENSIVE METABOLIC PANEL
ALT: 10 U/L (ref 0–50)
AST: 19 U/L (ref 0–50)
Albumin: 3 g/dL — ABNORMAL LOW (ref 3.5–5.2)
Alk Phos: 102 U/L (ref 40–130)
Anion Gap: 9 (ref 7–16)
Bilirubin,Total: 0.4 mg/dL (ref 0.0–1.2)
CO2: 22 mmol/L (ref 20–28)
Calcium: 8.3 mg/dL — ABNORMAL LOW (ref 8.6–10.2)
Chloride: 106 mmol/L (ref 96–108)
Creatinine: 0.78 mg/dL (ref 0.67–1.17)
Glucose: 154 mg/dL — ABNORMAL HIGH (ref 60–99)
Lab: 16 mg/dL (ref 6–20)
Potassium: 3.6 mmol/L (ref 3.4–4.7)
Sodium: 137 mmol/L (ref 133–145)
Total Protein: 6 g/dL — ABNORMAL LOW (ref 6.3–7.7)
eGFR BY CREAT: 100 *

## 2021-04-15 LAB — DIC PROFILE
D-Dimer: 1.78 ug/mL FEU — ABNORMAL HIGH (ref 0.00–0.50)
Fibrinogen: 621 mg/dL — ABNORMAL HIGH (ref 172–409)
Hematocrit: 34 % — ABNORMAL LOW (ref 40–51)
Hemoglobin: 11.9 g/dL — ABNORMAL LOW (ref 13.7–17.5)
INR: 1.5 — ABNORMAL HIGH (ref 0.9–1.1)
Platelets: 230 10*3/uL (ref 150–330)
Protime: 17.5 s — ABNORMAL HIGH (ref 10.0–12.9)
aPTT: 29.4 s (ref 25.8–37.9)

## 2021-04-15 LAB — POCT GLUCOSE
Glucose POCT: 122 mg/dL — ABNORMAL HIGH (ref 60–99)
Glucose POCT: 181 mg/dL — ABNORMAL HIGH (ref 60–99)

## 2021-04-15 LAB — VANCOMYCIN, TROUGH: Vancomycin Trough: 18.9 ug/mL (ref 10.0–20.0)

## 2021-04-15 LAB — SEDIMENTATION RATE, AUTOMATED

## 2021-04-15 LAB — SPEC COAG REVIEW

## 2021-04-15 MED ORDER — IOHEXOL 350 MG/ML (OMNIPAQUE) IV SOLN *I*
1.0000 mL | Freq: Once | INTRAVENOUS | Status: AC
Start: 2021-04-15 — End: 2021-04-15
  Administered 2021-04-15: 140 mL via INTRAVENOUS

## 2021-04-15 NOTE — Progress Notes (Signed)
Report given to charge nurse and patient transferred to 2c

## 2021-04-15 NOTE — Progress Notes (Signed)
Pt rates his pain at 8/10 to l hip, administered dilaudid 0.5 mg at this time.

## 2021-04-15 NOTE — Progress Notes (Signed)
Pt rates pain at 7/10 to left leg, administered oxycodone 10 mg at this time.

## 2021-04-15 NOTE — Progress Notes (Signed)
Patient resting in bed, Incision to l hip covered with abd, removed and replaced, surgical incision is well approximated and staples intact, no drainage noted, no redness noted. L thigh is noteabley larger in circumference than r thigh, per dayshift RN he did have an xray to assess this today. Pt states pain is on the inner portion of anterior aspect of l thigh. Denies need for pain medication at this time. Please see assessment for further info.

## 2021-04-15 NOTE — Progress Notes (Signed)
[facility] Progress Note      SUBJECTIVE:   The patient was evaluated at bedside, patient reports continued pain at the site of the left hip  Still denies any further hematemesis  Hgb down further.     Active Hospital Medications:   Scheduled Meds  vancomycin  2,000 mg Intravenous Q12H    atorvastatin  10 mg Oral Daily    pramipexole  1 mg Oral TID    tamsulosin  0.4 mg Oral QPM    FLUoxetine  10 mg Oral Daily    clonazePAM  1 mg Oral 2 times per day    gabapentin  300 mg Oral Nightly    ceFEPime (MAXIPIME) IV  2,000 mg Intravenous Q8H    pantoprazole  40 mg Intravenous Q24H    enoxaparin  1 mg/kg Subcutaneous Q12H    insulin lispro  0-4 units Subcutaneous TID WC    insulin glargine  20 units Subcutaneous Nightly      Continuous Infusions  Vancomycin - Pharmacist to Dose        PRN Meds ketorolac, phenazopyridine, HYDROmorphone hcl, oxyCODONE, naloxone, Nursing communication- Give 4 OZ of fruit juice for BG < 70 mg/dl **AND** dextrose **AND** dextrose **AND** glucagon **AND** POCT glucose      OBJECTIVE:    Current Vitals: BP 125/88 (BP Location: Left arm)    Pulse 80    Temp 36.8 C (98.2 F) (Oral)    Resp 16    Ht 1.88 m (6' 2.02")    Wt 122.5 kg (270 lb 1 oz)    SpO2 98%    BMI 34.66 kg/m      Weight:   Last weight   04/14/21 122.5 kg (270 lb 1 oz)   04/05/21 123.4 kg (272 lb)        I/O:     Intake/Output Summary (Last 24 hours) at 04/15/2021 1637  Last data filed at 04/15/2021 0654  Gross per 24 hour   Intake 1918 ml   Output 850 ml   Net 1068 ml       Physical Exam:   Physical Exam  Vitals and nursing note reviewed.   Constitutional:       Appearance: Normal appearance.   HENT:      Head: Normocephalic and atraumatic.      Nose: Nose normal.      Mouth/Throat:      Mouth: Mucous membranes are moist.      Pharynx: Oropharynx is clear.   Eyes:      Extraocular Movements: Extraocular movements intact.      Pupils: Pupils are equal, round, and reactive to light.   Cardiovascular:      Rate and Rhythm:  Normal rate and regular rhythm.   Pulmonary:      Effort: Pulmonary effort is normal. No respiratory distress.      Breath sounds: No wheezing or rhonchi.   Abdominal:      General: Bowel sounds are normal. There is no distension.      Palpations: Abdomen is soft.      Tenderness: There is no abdominal tenderness.   Genitourinary:     Comments: Foley  Musculoskeletal:         General: Tenderness (left hip with significant erythema and discrepancy in size of thigh) present. No swelling.      Right lower leg: No edema.      Left lower leg: Edema present.   Skin:     General: Skin is warm  and dry.      Capillary Refill: Capillary refill takes 2 to 3 seconds.   Neurological:      General: No focal deficit present.      Mental Status: He is alert and oriented to person, place, and time.   Psychiatric:         Mood and Affect: Mood normal.         Behavior: Behavior normal.           LABS   Recent Results (from the past 24 hour(s))   POCT glucose    Collection Time: 04/14/21  5:25 PM   Result Value    Glucose POCT 146 (H)   POCT glucose    Collection Time: 04/14/21 10:40 PM   Result Value    Glucose POCT 108 (H)   CBC and differential    Collection Time: 04/15/21  5:18 AM   Result Value    WBC 4.5    RBC 3.4 (L)    Hemoglobin 10.1 (L)    Hematocrit 30 (L)    MCV 89    MCH 30    MCHC 33    RDW 12.4    Platelets 212    Seg Neut % 69.4    Lymphocyte % 15.4    Monocyte % 9.3    Eosinophil % 4.8    Basophil % 0.7    Neut # K/uL 3.2    Lymph # K/uL 0.7 (L)    Mono # K/uL 0.4    Eos # K/uL 0.2    Baso # K/uL 0.0    Nucl RBC % 0.0    Nucl RBC # K/uL 0.0    IMM Granulocytes # 0.0    IMM Granulocytes 0.4   Comprehensive metabolic panel    Collection Time: 04/15/21  5:18 AM   Result Value    Sodium 137    Potassium 3.6    Chloride 106    CO2 22    Anion Gap 9    UN 16    Creatinine 0.78    eGFR BY CREAT 100    Glucose 154 (H)    Calcium 8.3 (L)    Total Protein 6.0 (L)    Albumin 3.0 (L)    Bilirubin,Total 0.4    AST 19    ALT 10     Alk Phos 102   Vancomycin, trough    Collection Time: 04/15/21 12:43 PM   Result Value    Vancomycin Trough 18.9             Microbiology results   Results for orders placed or performed during the hospital encounter of 04/13/21 (from the past 2016 hour(s))   Blood culture    Collection Time: 04/13/21 12:52 AM    Specimen: Blood: aerobic/anaerobic; NO SITE PROVIDED   Result Value Ref Range    Bacterial Blood Culture .     Narrative    No growth to date   Blood culture    Collection Time: 04/13/21 12:52 AM    Specimen: Blood: aerobic/anaerobic; NO SITE PROVIDED   Result Value Ref Range    Bacterial Blood Culture .     Narrative    No growth to date   Bacterial urine culture    Collection Time: 04/13/21 12:57 AM    Specimen: Urine (Clean catch, voided, midstream)   Result Value Ref Range    Aerobic Culture .     Narrative    No growth  MRSA (ORSA) amplification    Collection Time: 04/13/21  5:38 AM    Specimen: Nares   Result Value Ref Range    MRSA (ORSA) Amplification .     Narrative    Negative for MRSA Nucleic Acid  Test Method:  Nucleic Acid Amplification by PCR           Radiology results    CT hip LEFT with IV contrast    Result Date: 04/14/2021  Interval increase in size of the complex periarticular fluid collections adjacent to the proximal femur. Interval development of a subacute pathologic displaced fracture at the basicervical femoral neck with posterior displacement and varus angulation at the fracture site. At the time of surgery for the left femoral fracture consider repeating biopsies of the fluid collection and underlying bone to evaluate for infection and to exclude other etiologies. END OF IMPRESSION I have personally reviewed the images and the Resident's/Fellow's interpretation and agree with or edited the findings. [facility] submits this DICOM format image data and final report to the [facility], an independent secure electronic health information exchange, on a reciprocally searchable  basis (with patient authorization) for a minimum of 12 months after exam date.    Chest single frontal view    Result Date: 04/13/2021  No acute cardiopulmonary disease. END OF IMPRESSION I have personally reviewed the images and the Resident's/Fellow's interpretation and agree with or edited the findings. [facility] submits this DICOM format image data and final report to the [facility], an independent secure electronic health information exchange, on a reciprocally searchable basis (with patient authorization) for a minimum of 12 months after exam date.         ASSESSMENT/PLAN:       Active Problems:    Sepsis, due to unspecified organism, unspecified whether acute organ dysfunction present     Severe sepsis   Septic arthritis with infected abscess of the left native hip status post washout on 03/26/2021, clinical concern for underlying malignancy  Left Femoral Neck Fracture likely atraumatic 2/2 acute osteomyelitis   Acute ostemyelitis  -- Vanc/Cefepime  -- Await Cultures   -- Monitor Critical Is and Os   -- Awaiting Transfer under Orthopedics   Hematemesis resolved    Acute blood loss anemia likely 2/2 UGIB/Dilutional   Recent left lower extremity DVT anticoagulated with Xarelto at home  -- Continue Current measures, trend CBC   -- Lovenox given Pre Operative status     Diabetes mellitus  -- Accucheck, Lantus + Low dose scale     Hyperlipidemia  -- Lipitor     History of pulmonary embolism    Anxiety, with chronic BZ dependence  Chronic pain  RLS  -- Neurontin, Mirapex, Klonopin, Prozac    Chronic urinary retention with chronic Foley catheter  -- Foley Patent     DVT Prophylaxis: Lovenox  GI Prophylaxis: PPI  Code:   Code Status Information     Code Status Advance Care Planning    Full Code Jump to the Activity         Total time spent during encounter > 35 min spent in care of this pt with >50% in counseling/coordination of care    Author: Fidela Salisbury, MD  as of: 04/15/2021  at: 4:37 PM

## 2021-04-15 NOTE — Progress Notes (Signed)
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 Today is day 3 of therapy.    Goal vancomycin trough is 15-20 mcg/mL.    Laboratory Data      Lab results: 04/15/21  0518 04/14/21  0513 04/13/21  0539   WBC 4.5 5.6 8.6         Lab results: 04/15/21  0518 04/14/21  0513 04/13/21  0539   Creatinine 0.78 0.87 0.90         Lab results: 04/15/21  0518 04/14/21  0513 04/13/21  0539   UN 16 15 20      Vancomycin concentrations:       Lab results: 04/15/21  1243   Vancomycin Trough 18.9       Assessment and Plan   Considering patient's renal function, volume status, clinical status, and reported concentrations, 2000 mg Q12H will obtain goal vancomycin concentration unless renal function or clinical status changes.    Next vancomycin concentration before the 4th or 5th dose, order placed to be drawn on 04/17/21 at 1300.    Pharmacist will follow for changes in renal function, toxicity, and efficacy and order serum concentrations and creatinine as needed.      For questions contact pharmacy at [place], PharmD

## 2021-04-16 LAB — CBC AND DIFFERENTIAL
Baso # K/uL: 0 10*3/uL (ref 0.0–0.1)
Basophil %: 0.7 %
Eos # K/uL: 0.3 10*3/uL (ref 0.0–0.5)
Eosinophil %: 7.1 %
Hematocrit: 34 % — ABNORMAL LOW (ref 40–51)
Hemoglobin: 11.8 g/dL — ABNORMAL LOW (ref 13.7–17.5)
IMM Granulocytes #: 0 10*3/uL (ref 0.0–0.0)
IMM Granulocytes: 0.7 %
Lymph # K/uL: 1.1 10*3/uL — ABNORMAL LOW (ref 1.3–3.6)
Lymphocyte %: 24.4 %
MCH: 30 pg (ref 26–32)
MCHC: 34 g/dL (ref 32–37)
MCV: 87 fL (ref 79–92)
Mono # K/uL: 0.4 10*3/uL (ref 0.3–0.8)
Monocyte %: 10.1 %
Neut # K/uL: 2.5 10*3/uL (ref 1.8–5.4)
Nucl RBC # K/uL: 0 10*3/uL (ref 0.0–0.0)
Nucl RBC %: 0 /100 WBC (ref 0.0–0.2)
Platelets: 279 10*3/uL (ref 150–330)
RBC: 3.9 MIL/uL — ABNORMAL LOW (ref 4.6–6.1)
RDW: 12 % (ref 11.6–14.4)
Seg Neut %: 57 %
WBC: 4.4 10*3/uL (ref 4.2–9.1)

## 2021-04-16 LAB — COMPREHENSIVE METABOLIC PANEL
ALT: 12 U/L (ref 0–50)
AST: 15 U/L (ref 0–50)
Albumin: 3.5 g/dL (ref 3.5–5.2)
Alk Phos: 116 U/L (ref 40–130)
Anion Gap: 12 (ref 7–16)
Bilirubin,Total: 0.4 mg/dL (ref 0.0–1.2)
CO2: 19 mmol/L — ABNORMAL LOW (ref 20–28)
Calcium: 9.1 mg/dL (ref 8.6–10.2)
Chloride: 106 mmol/L (ref 96–108)
Creatinine: 0.73 mg/dL (ref 0.67–1.17)
Glucose: 102 mg/dL — ABNORMAL HIGH (ref 60–99)
Lab: 12 mg/dL (ref 6–20)
Potassium: 3.8 mmol/L (ref 3.4–4.7)
Sodium: 137 mmol/L (ref 133–145)
Total Protein: 7.1 g/dL (ref 6.3–7.7)
eGFR BY CREAT: 102 *

## 2021-04-16 LAB — POCT GLUCOSE
Glucose POCT: 98 mg/dL (ref 60–99)
Glucose POCT: 118 mg/dL — ABNORMAL HIGH (ref 60–99)
Glucose POCT: 99 mg/dL (ref 60–99)
Glucose POCT: 136 mg/dL — ABNORMAL HIGH (ref 60–99)

## 2021-04-16 NOTE — Progress Notes (Signed)
[facility] Progress Note      SUBJECTIVE:   The patient was evaluated at bedside, patient reports continued pain at the site of the left hip  Still denies any further hematemesis, and hgb stabilized.       Active Hospital Medications:   Scheduled Meds  vancomycin  2,000 mg Intravenous Q12H    atorvastatin  10 mg Oral Daily    pramipexole  1 mg Oral TID    tamsulosin  0.4 mg Oral QPM    FLUoxetine  10 mg Oral Daily    clonazePAM  1 mg Oral 2 times per day    gabapentin  300 mg Oral Nightly    ceFEPime (MAXIPIME) IV  2,000 mg Intravenous Q8H    pantoprazole  40 mg Intravenous Q24H    enoxaparin  1 mg/kg Subcutaneous Q12H    insulin lispro  0-4 units Subcutaneous TID WC    insulin glargine  20 units Subcutaneous Nightly      Continuous Infusions  Vancomycin - Pharmacist to Dose        PRN Meds ketorolac, phenazopyridine, HYDROmorphone hcl, oxyCODONE, naloxone, Nursing communication- Give 4 OZ of fruit juice for BG < 70 mg/dl **AND** dextrose **AND** dextrose **AND** glucagon **AND** POCT glucose      OBJECTIVE:    Current Vitals: BP 111/58    Pulse 58    Temp 36.8 C (98.3 F) (Oral)    Resp 18    Ht 1.88 m (6' 2.02")    Wt 122.5 kg (270 lb 1 oz)    SpO2 95%    BMI 34.66 kg/m      Weight:   Last weight   04/14/21 122.5 kg (270 lb 1 oz)   04/05/21 123.4 kg (272 lb)        I/O:     Intake/Output Summary (Last 24 hours) at 04/16/2021 1035  Last data filed at 04/16/2021 0559  Gross per 24 hour   Intake 1450 ml   Output 2550 ml   Net -1100 ml       Physical Exam:   Physical Exam  Vitals and nursing note reviewed.   Constitutional:       Appearance: Normal appearance.   HENT:      Head: Normocephalic and atraumatic.      Nose: Nose normal.      Mouth/Throat:      Mouth: Mucous membranes are moist.      Pharynx: Oropharynx is clear.   Eyes:      Extraocular Movements: Extraocular movements intact.      Pupils: Pupils are equal, round, and reactive to light.   Cardiovascular:      Rate and Rhythm: Normal rate and regular  rhythm.   Pulmonary:      Effort: Pulmonary effort is normal. No respiratory distress.      Breath sounds: No wheezing or rhonchi.   Abdominal:      General: Bowel sounds are normal. There is no distension.      Palpations: Abdomen is soft.      Tenderness: There is no abdominal tenderness.   Genitourinary:     Comments: Foley  Musculoskeletal:         General: Tenderness (left hip with significant erythema and discrepancy in size of thigh) present. No swelling.      Right lower leg: No edema.      Left lower leg: Edema present.   Skin:     General: Skin is warm and dry.  Capillary Refill: Capillary refill takes 2 to 3 seconds.   Neurological:      General: No focal deficit present.      Mental Status: He is alert and oriented to person, place, and time.   Psychiatric:         Mood and Affect: Mood normal.         Behavior: Behavior normal.           LABS   Recent Results (from the past 24 hour(s))   Vancomycin, trough    Collection Time: 04/15/21 12:43 PM   Result Value    Vancomycin Trough 18.9   POCT glucose    Collection Time: 04/15/21  5:11 PM   Result Value    Glucose POCT 122 (H)   POCT glucose    Collection Time: 04/15/21  8:57 PM   Result Value    Glucose POCT 181 (H)   Comprehensive metabolic panel    Collection Time: 04/16/21  6:12 AM   Result Value    Sodium 137    Potassium 3.8    Chloride 106    CO2 19 (L)    Anion Gap 12    UN 12    Creatinine 0.73    eGFR BY CREAT 102    Glucose 102 (H)    Calcium 9.1    Total Protein 7.1    Albumin 3.5    Bilirubin,Total 0.4    AST 15    ALT 12    Alk Phos 116   CBC and differential    Collection Time: 04/16/21  6:12 AM   Result Value    WBC 4.4    RBC 3.9 (L)    Hemoglobin 11.8 (L)    Hematocrit 34 (L)    MCV 87    MCH 30    MCHC 34    RDW 12.0    Platelets 279    Seg Neut % 57.0    Lymphocyte % 24.4    Monocyte % 10.1    Eosinophil % 7.1    Basophil % 0.7    Neut # K/uL 2.5    Lymph # K/uL 1.1 (L)    Mono # K/uL 0.4    Eos # K/uL 0.3    Baso # K/uL 0.0    Nucl  RBC % 0.0    Nucl RBC # K/uL 0.0    IMM Granulocytes # 0.0    IMM Granulocytes 0.7   POCT glucose    Collection Time: 04/16/21  6:16 AM   Result Value    Glucose POCT 98             Microbiology results   Results for orders placed or performed during the hospital encounter of 04/13/21 (from the past 2016 hour(s))   Blood culture    Collection Time: 04/13/21 12:52 AM    Specimen: Blood: aerobic/anaerobic; NO SITE PROVIDED   Result Value Ref Range    Bacterial Blood Culture .     Narrative    No growth to date   Blood culture    Collection Time: 04/13/21 12:52 AM    Specimen: Blood: aerobic/anaerobic; NO SITE PROVIDED   Result Value Ref Range    Bacterial Blood Culture .     Narrative    No growth to date   Bacterial urine culture    Collection Time: 04/13/21 12:57 AM    Specimen: Urine (Clean catch, voided, midstream)   Result Value Ref Range  Aerobic Culture .     Narrative    No growth   MRSA (ORSA) amplification    Collection Time: 04/13/21  5:38 AM    Specimen: Nares   Result Value Ref Range    MRSA (ORSA) Amplification .     Narrative    Negative for MRSA Nucleic Acid  Test Method:  Nucleic Acid Amplification by PCR           Radiology results    CT hip LEFT with IV contrast    Result Date: 04/14/2021  Interval increase in size of the complex periarticular fluid collections adjacent to the proximal femur. Interval development of a subacute pathologic displaced fracture at the basicervical femoral neck with posterior displacement and varus angulation at the fracture site. At the time of surgery for the left femoral fracture consider repeating biopsies of the fluid collection and underlying bone to evaluate for infection and to exclude other etiologies. END OF IMPRESSION I have personally reviewed the images and the Resident's/Fellow's interpretation and agree with or edited the findings. [facility] submits this DICOM format image data and final report to the [facility], an independent secure electronic  health information exchange, on a reciprocally searchable basis (with patient authorization) for a minimum of 12 months after exam date.    Chest single frontal view    Result Date: 04/13/2021  No acute cardiopulmonary disease. END OF IMPRESSION I have personally reviewed the images and the Resident's/Fellow's interpretation and agree with or edited the findings. [facility] submits this DICOM format image data and final report to the [facility], an independent secure electronic health information exchange, on a reciprocally searchable basis (with patient authorization) for a minimum of 12 months after exam date.         ASSESSMENT/PLAN:       Active Problems:    Sepsis, due to unspecified organism, unspecified whether acute organ dysfunction present     Severe sepsis   Septic arthritis with infected abscess of the left native hip status post washout on 03/26/2021, clinical concern for underlying malignancy  Left Femoral Neck Fracture likely atraumatic 2/2 acute osteomyelitis, CT reviewed  Acute ostemyelitis  -- Vanc/Cefepime  -- Await Cultures   -- Monitor Critical Is and Os   -- Awaiting Transfer under Orthopedics   Hematemesis resolved    Acute blood loss anemia likely 2/2 UGIB/Dilutional   Recent left lower extremity DVT anticoagulated with Xarelto at home  -- Continue Current measures, trend CBC   -- Lovenox given Pre Operative status     Diabetes mellitus  -- Accucheck, Lantus + Low dose scale     Hyperlipidemia  -- Lipitor     History of pulmonary embolism    Anxiety, with chronic BZ dependence  Chronic pain  RLS  -- Neurontin, Mirapex, Klonopin, Prozac    Chronic urinary retention with chronic Foley catheter  -- Foley Patent     DVT Prophylaxis: Lovenox  GI Prophylaxis: PPI  Code:   Code Status Information     Code Status Advance Care Planning    Full Code Jump to the Activity         Total time spent during encounter > 35 min spent in care of this pt with >50% in counseling/coordination of care    Author:  Fidela Salisbury, MD  as of: 04/16/2021  at: 10:35 AM

## 2021-04-16 NOTE — Progress Notes (Signed)
FOLLOW-UP NUTRITION NOTE    Admit weight: Weight: 123.4 kg (272 lb)  Most Recent Weight:   Last weight   04/14/21 122.5 kg (270 lb 1 oz)       Current Diet Order: Diet fat restricted (low fat) Diet Consistent Carbohydrate; Fat restricted (low fat), Sodium restricted 2-3gm (low)    Appetite/Intake:> 75% of meals          Based on the above information:        Pt treating with left hip pain, severe sepsis per notes. Awaiting transfer.    Labs 04/16/21: Na 137, K 3.8, Bun 12, Cr. 0.7, Glu 102 H, Alb. 3.5, Hgb 11.8 L.

## 2021-04-17 LAB — COMPREHENSIVE METABOLIC PANEL
ALT: 12 U/L (ref 0–50)
AST: 15 U/L (ref 0–50)
Albumin: 3.4 g/dL — ABNORMAL LOW (ref 3.5–5.2)
Alk Phos: 109 U/L (ref 40–130)
Anion Gap: 11 (ref 7–16)
Bilirubin,Total: 0.4 mg/dL (ref 0.0–1.2)
CO2: 22 mmol/L (ref 20–28)
Calcium: 9.3 mg/dL (ref 8.6–10.2)
Chloride: 104 mmol/L (ref 96–108)
Creatinine: 0.82 mg/dL (ref 0.67–1.17)
Glucose: 93 mg/dL (ref 60–99)
Lab: 14 mg/dL (ref 6–20)
Potassium: 3.5 mmol/L (ref 3.4–4.7)
Sodium: 137 mmol/L (ref 133–145)
Total Protein: 6.8 g/dL (ref 6.3–7.7)
eGFR BY CREAT: 99 *

## 2021-04-17 LAB — CBC AND DIFFERENTIAL
Baso # K/uL: 0 10*3/uL (ref 0.0–0.1)
Basophil %: 0.4 %
Eos # K/uL: 0.1 10*3/uL (ref 0.0–0.5)
Eosinophil %: 2.8 %
Hematocrit: 33 % — ABNORMAL LOW (ref 40–51)
Hemoglobin: 11 g/dL — ABNORMAL LOW (ref 13.7–17.5)
IMM Granulocytes #: 0 10*3/uL (ref 0.0–0.0)
IMM Granulocytes: 0.4 %
Lymph # K/uL: 0.9 10*3/uL — ABNORMAL LOW (ref 1.3–3.6)
Lymphocyte %: 18.8 %
MCH: 30 pg (ref 26–32)
MCHC: 34 g/dL (ref 32–37)
MCV: 87 fL (ref 79–92)
Mono # K/uL: 0.5 10*3/uL (ref 0.3–0.8)
Monocyte %: 9.2 %
Neut # K/uL: 3.4 10*3/uL (ref 1.8–5.4)
Nucl RBC # K/uL: 0 10*3/uL (ref 0.0–0.0)
Nucl RBC %: 0 /100 WBC (ref 0.0–0.2)
Platelets: 259 10*3/uL (ref 150–330)
RBC: 3.7 MIL/uL — ABNORMAL LOW (ref 4.6–6.1)
RDW: 12.2 % (ref 11.6–14.4)
Seg Neut %: 68.4 %
WBC: 5 10*3/uL (ref 4.2–9.1)

## 2021-04-17 LAB — POCT GLUCOSE
Glucose POCT: 94 mg/dL (ref 60–99)
Glucose POCT: 98 mg/dL (ref 60–99)
Glucose POCT: 155 mg/dL — ABNORMAL HIGH (ref 60–99)
Glucose POCT: 153 mg/dL — ABNORMAL HIGH (ref 60–99)

## 2021-04-17 LAB — COVID-19 NAAT (PCR): COVID-19 NAAT (PCR): NEGATIVE

## 2021-04-17 LAB — VANCOMYCIN, TROUGH: Vancomycin Trough: 21.7 ug/mL — ABNORMAL HIGH (ref 10.0–20.0)

## 2021-04-17 LAB — PERFORMING LAB

## 2021-04-17 MED ORDER — HEPARIN LOCK FLUSH 10 UNIT/ML IJ SOLN WRAPPED *I*
INTRAVENOUS | Status: AC
Start: 2021-04-17 — End: 2021-04-17
  Filled 2021-04-17: qty 5

## 2021-04-17 NOTE — Progress Notes (Signed)
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 Today is day 5 of therapy.    Goal vancomycin trough is >54mcg/mL.    Laboratory Data      Lab results: 04/17/21  0552 04/16/21  0612 04/15/21  0518   WBC 5.0 4.4 4.5         Lab results: 04/17/21  0552 04/16/21  0612 04/15/21  0518   Creatinine 0.82 0.73 0.78         Lab results: 04/17/21  0552 04/16/21  0612 04/15/21  0518   UN 14 12 16      Vancomycin concentrations:       Lab results: 04/17/21  1303   Vancomycin Trough 21.7*       Assessment and Plan   Considering patient's renal function, volume status, clinical status, and reported concentrations, 2000 mg q12h will obtain goal vancomycin concentration unless renal function or clinical status changes.    Next vancomycin concentration prior to 3rd dose, order placed to be drawn on 04/17/21 at 1300.    Pharmacist will follow for changes in renal function, toxicity, and efficacy and order serum concentrations and creatinine as needed.    Antimicrobial Stewardship Program has approved for 7 days    For questions contact pharmacy at [place], PharmD

## 2021-04-17 NOTE — Discharge Summary (Signed)
Name: Craig Gilbert MRN: [id number] DOB: Aug 18, 1956     Admit Date: 04/13/2021       Patient was accepted for discharge to [facility]                Hospitalization Summary      Patient is a 65 year old M, PMH of type II DM, HTN, HLD, urinary retention, PE on Eliquis with recently diagnosed LLE DVT (failed anticoagulation), for osteo/left native  hip abscess. S/P orthopedic open left hip joint washout procedure (on 03/26/2021). Intraoperative left hip wound cultures (sent to [facility], pathology results are still pending), Treated initially with cefepime and vancomycin, later transitioned to cefdinir and doxycycline for 2 weeks. Patient was discharged with a PICC line, for any future need of IV antibiotics.Patient was previously on Eliquis prior to hospitalization for history of pulmonary embolism. Patient was found to have left lower extremity DVTs found recently.   He presented again to the ER with fever, chills, nausea and vomiting with clinical concern for sepsis syndrome in context of infected left hip abscess and pathologic fracture of the left femoral neck diagnosed on CT imaging.    Patient has been stabilized over the course of the hospitalization, with blood culture data remaining negative thus far.    Severe sepsis   Septic arthritis with infected abscess of the left native hip status post washout on 03/26/2021, clinical concern for underlying malignancy  Pathologic Left Femoral Neck Fracture likely atraumatic 2/2 acute osteomyelitis, CT reviewed  Acute ostemyelitis  Acute blood loss anemia likely 2/2 UGIB/Dilutional   Recent left lower extremity DVT anticoagulated with Xarelto at home  -- Lovenox given Pre Operative status     Diabetes mellitus  Hyperlipidemia  History of pulmonary embolism  Anxiety, with chronic BZ dependence  Chronic pain  RLS  Chronic urinary retention with chronic Foley catheter                                Signed: Fidela Salisbury, MD  On: 04/17/2021  at: 6:26 PM

## 2021-04-17 NOTE — Progress Notes (Signed)
.   [facility] Progress Note      SUBJECTIVE:   The patient was evaluated at bedside, patient reports continued pain at the site of the left hip.   Was lowered to the ground today, no obvious injury. Made it clear to the patient he needs to strictly bedrest nursing.    Active Hospital Medications:   Scheduled Meds  vancomycin  2,000 mg Intravenous Q12H    atorvastatin  10 mg Oral Daily    pramipexole  1 mg Oral TID    tamsulosin  0.4 mg Oral QPM    FLUoxetine  10 mg Oral Daily    clonazePAM  1 mg Oral 2 times per day    gabapentin  300 mg Oral Nightly    ceFEPime (MAXIPIME) IV  2,000 mg Intravenous Q8H    pantoprazole  40 mg Intravenous Q24H    enoxaparin  1 mg/kg Subcutaneous Q12H    insulin lispro  0-4 units Subcutaneous TID WC    insulin glargine  20 units Subcutaneous Nightly      Continuous Infusions  Vancomycin - Pharmacist to Dose        PRN Meds ketorolac, phenazopyridine, HYDROmorphone hcl, oxyCODONE, naloxone, Nursing communication- Give 4 OZ of fruit juice for BG < 70 mg/dl **AND** dextrose **AND** dextrose **AND** glucagon **AND** POCT glucose      OBJECTIVE:    Current Vitals: BP 118/74    Pulse 85    Temp 36.6 C (97.9 F) (Oral)    Resp 16    Ht 1.88 m (6' 2.02")    Wt 122.5 kg (270 lb 1 oz)    SpO2 97%    BMI 34.66 kg/m      Weight:   Last weight   04/14/21 122.5 kg (270 lb 1 oz)   04/05/21 123.4 kg (272 lb)        I/O:     Intake/Output Summary (Last 24 hours) at 04/17/2021 1257  Last data filed at 04/17/2021 0359  Gross per 24 hour   Intake    Output 2550 ml   Net -2550 ml       Physical Exam:   Physical Exam  Vitals and nursing note reviewed.   Constitutional:       Appearance: Normal appearance.   HENT:      Head: Normocephalic and atraumatic.      Nose: Nose normal.      Mouth/Throat:      Mouth: Mucous membranes are moist.      Pharynx: Oropharynx is clear.   Eyes:      Extraocular Movements: Extraocular movements intact.      Pupils: Pupils are equal, round, and reactive to light.    Cardiovascular:      Rate and Rhythm: Normal rate and regular rhythm.   Pulmonary:      Effort: Pulmonary effort is normal. No respiratory distress.      Breath sounds: No wheezing or rhonchi.   Abdominal:      General: Bowel sounds are normal. There is no distension.      Palpations: Abdomen is soft.      Tenderness: There is no abdominal tenderness.   Genitourinary:     Comments: Foley  Musculoskeletal:         General: Tenderness (left hip with significant erythema and discrepancy in size of thigh) present. No swelling.      Right lower leg: No edema.      Left lower leg: Edema present.  Skin:     General: Skin is warm and dry.      Capillary Refill: Capillary refill takes less than 2 seconds.   Neurological:      General: No focal deficit present.      Mental Status: He is alert and oriented to person, place, and time.   Psychiatric:         Mood and Affect: Mood normal.         Behavior: Behavior normal.           LABS   Recent Results (from the past 24 hour(s))   POCT glucose    Collection Time: 04/16/21  5:03 PM   Result Value    Glucose POCT 99   POCT glucose    Collection Time: 04/16/21  9:23 PM   Result Value    Glucose POCT 136 (H)   Comprehensive metabolic panel    Collection Time: 04/17/21  5:52 AM   Result Value    Sodium 137    Potassium 3.5    Chloride 104    CO2 22    Anion Gap 11    UN 14    Creatinine 0.82    eGFR BY CREAT 99    Glucose 93    Calcium 9.3    Total Protein 6.8    Albumin 3.4 (L)    Bilirubin,Total 0.4    AST 15    ALT 12    Alk Phos 109   CBC and differential    Collection Time: 04/17/21  5:52 AM   Result Value    WBC 5.0    RBC 3.7 (L)    Hemoglobin 11.0 (L)    Hematocrit 33 (L)    MCV 87    MCH 30    MCHC 34    RDW 12.2    Platelets 259    Seg Neut % 68.4    Lymphocyte % 18.8    Monocyte % 9.2    Eosinophil % 2.8    Basophil % 0.4    Neut # K/uL 3.4    Lymph # K/uL 0.9 (L)    Mono # K/uL 0.5    Eos # K/uL 0.1    Baso # K/uL 0.0    Nucl RBC % 0.0    Nucl RBC # K/uL 0.0    IMM  Granulocytes # 0.0    IMM Granulocytes 0.4   POCT glucose    Collection Time: 04/17/21  6:23 AM   Result Value    Glucose POCT 94   POCT glucose    Collection Time: 04/17/21 11:32 AM   Result Value    Glucose POCT 98             Microbiology results   Results for orders placed or performed during the hospital encounter of 04/13/21 (from the past 2016 hour(s))   Blood culture    Collection Time: 04/13/21 12:52 AM    Specimen: Blood: aerobic/anaerobic; NO SITE PROVIDED   Result Value Ref Range    Bacterial Blood Culture .     Narrative    No growth to date   Blood culture    Collection Time: 04/13/21 12:52 AM    Specimen: Blood: aerobic/anaerobic; NO SITE PROVIDED   Result Value Ref Range    Bacterial Blood Culture .     Narrative    No growth to date   Bacterial urine culture    Collection Time: 04/13/21 12:57 AM  Specimen: Urine (Clean catch, voided, midstream)   Result Value Ref Range    Aerobic Culture .     Narrative    No growth   MRSA (ORSA) amplification    Collection Time: 04/13/21  5:38 AM    Specimen: Nares   Result Value Ref Range    MRSA (ORSA) Amplification .     Narrative    Negative for MRSA Nucleic Acid  Test Method:  Nucleic Acid Amplification by PCR           Radiology results    CT hip LEFT with IV contrast    Result Date: 04/14/2021  Interval increase in size of the complex periarticular fluid collections adjacent to the proximal femur. Interval development of a subacute pathologic displaced fracture at the basicervical femoral neck with posterior displacement and varus angulation at the fracture site. At the time of surgery for the left femoral fracture consider repeating biopsies of the fluid collection and underlying bone to evaluate for infection and to exclude other etiologies. END OF IMPRESSION I have personally reviewed the images and the Resident's/Fellow's interpretation and agree with or edited the findings. [facility] submits this DICOM format image data and final report to the  [facility], an independent secure electronic health information exchange, on a reciprocally searchable basis (with patient authorization) for a minimum of 12 months after exam date.    Chest single frontal view    Result Date: 04/13/2021  No acute cardiopulmonary disease. END OF IMPRESSION I have personally reviewed the images and the Resident's/Fellow's interpretation and agree with or edited the findings. [facility] submits this DICOM format image data and final report to the [facility], an independent secure electronic health information exchange, on a reciprocally searchable basis (with patient authorization) for a minimum of 12 months after exam date.         ASSESSMENT/PLAN:       Active Problems:    Sepsis, due to unspecified organism, unspecified whether acute organ dysfunction present     Severe sepsis   Septic arthritis with infected abscess of the left native hip status post washout on 03/26/2021, clinical concern for underlying malignancy  Left Femoral Neck Fracture likely atraumatic 2/2 acute osteomyelitis, CT reviewed  Acute ostemyelitis  -- Vanc/Cefepime  -- Await Cultures which have been NGTD thus far.  -- Monitor Critical Is and Os   -- Awaiting Transfer under Orthopedics   Hematemesis resolved    Acute blood loss anemia likely 2/2 UGIB/Dilutional   Recent left lower extremity DVT anticoagulated with Xarelto at home  -- Continue Current measures, trend CBC   -- Lovenox given Pre Operative status     Diabetes mellitus  -- Accucheck, Lantus + Low dose scale     Hyperlipidemia  -- Lipitor     History of pulmonary embolism    Anxiety, with chronic BZ dependence  Chronic pain  RLS  -- Neurontin, Mirapex, Klonopin, Prozac    Chronic urinary retention with chronic Foley catheter  -- Foley Patent     DVT Prophylaxis: Lovenox  GI Prophylaxis: PPI  Code:   Code Status Information     Code Status Advance Care Planning    Full Code Jump to the Activity         Total time spent during encounter > 35 min  spent in care of this pt with >50% in counseling/coordination of care    Author: Fidela Salisbury, MD  as of: 04/17/2021  at: 12:57 PM

## 2021-04-17 NOTE — Progress Notes (Signed)
Upon entering the room, this writer visualized patient standing at bedside table fidgeting with his computer and then appeared to lean/shift weight to the right side, lowering himself to the ground. Patient did not hit his head, no other injury noted. VSS 141/79, HR 98, O2 94% RA. Dr. Dominica Severin in to assess the patient. No further orders at this time. Patient assisted to feet then positioned into chair. Call bell within reach and patient instructed to call out for any assistance and to not get out of chair without help, verbalized agreement.

## 2021-04-18 ENCOUNTER — Inpatient Hospital Stay: Admit: 2021-04-18 | Discharge: 2021-04-18 | Disposition: A | Discharge status: Home / Self Care | Payer: Self-pay

## 2021-04-18 DIAGNOSIS — C419 Malignant neoplasm of bone and articular cartilage, unspecified: Secondary | ICD-10-CM | POA: Insufficient documentation

## 2021-04-19 LAB — BLOOD CULTURE
Bacterial Blood Culture: 0
Bacterial Blood Culture: 0

## 2021-04-20 ENCOUNTER — Inpatient Hospital Stay: Admit: 2021-04-20 | Discharge: 2021-04-20 | Disposition: A | Discharge status: Home / Self Care | Payer: Self-pay

## 2021-04-21 DIAGNOSIS — F17200 Nicotine dependence, unspecified, uncomplicated: Secondary | ICD-10-CM | POA: Insufficient documentation

## 2021-04-22 ENCOUNTER — Inpatient Hospital Stay: Admit: 2021-04-22 | Discharge: 2021-04-22 | Disposition: A | Discharge status: Home / Self Care | Payer: Self-pay

## 2021-04-22 DIAGNOSIS — E669 Obesity, unspecified: Secondary | ICD-10-CM | POA: Insufficient documentation

## 2021-04-24 ENCOUNTER — Encounter: Payer: Self-pay | Admitting: Gastroenterology

## 2021-04-24 ENCOUNTER — Inpatient Hospital Stay: Admit: 2021-04-24 | Discharge: 2021-04-24 | Disposition: A | Discharge status: Home / Self Care | Payer: Self-pay

## 2021-04-24 DIAGNOSIS — R918 Other nonspecific abnormal finding of lung field: Secondary | ICD-10-CM | POA: Insufficient documentation

## 2021-04-24 DIAGNOSIS — R339 Retention of urine, unspecified: Secondary | ICD-10-CM | POA: Insufficient documentation

## 2021-04-24 DIAGNOSIS — S7290XA Unspecified fracture of unspecified femur, initial encounter for closed fracture: Secondary | ICD-10-CM | POA: Insufficient documentation

## 2021-04-24 DIAGNOSIS — G8918 Other acute postprocedural pain: Secondary | ICD-10-CM | POA: Insufficient documentation

## 2021-04-25 ENCOUNTER — Inpatient Hospital Stay: Admit: 2021-04-25 | Discharge: 2021-04-25 | Disposition: A | Discharge status: Home / Self Care | Payer: Self-pay

## 2021-05-08 ENCOUNTER — Telehealth: Payer: Self-pay | Admitting: Family Medicine

## 2021-05-08 DIAGNOSIS — S88919A Complete traumatic amputation of unspecified lower leg, level unspecified, initial encounter: Secondary | ICD-10-CM

## 2021-05-08 NOTE — Telephone Encounter (Signed)
Patient called to request an out of network referral for rehab through the [facility] in [place]. 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 [facility] will not approve him for rehab without an out of network referral. Patient stated that the [facility] has 3 or 4 external offices that do rehab, but he believes they want to send him to [facility] or [place], he wasn't sure what it was called.    Patient also requested a call back to let him know what we are able to do in regards to this at some point today. Thank you!

## 2021-05-09 NOTE — Telephone Encounter (Signed)
Referral ordered

## 2021-05-14 ENCOUNTER — Inpatient Hospital Stay: Admit: 2021-05-14 | Discharge: 2021-05-14 | Disposition: A | Discharge status: Home / Self Care | Payer: Self-pay

## 2021-05-19 ENCOUNTER — Telehealth: Payer: Self-pay | Admitting: Primary Care

## 2021-05-19 NOTE — Telephone Encounter (Signed)
Kayla from [place] called and states that a prior authorization  is required for the patient because the clinic is out of network.  She states it needs to be specifically for Physical Therapy at the [facility] and has to come from his PCP office. Also, she gave a provider services number for them to guide through this its [phone].     Please advise.

## 2021-06-05 ENCOUNTER — Inpatient Hospital Stay: Admit: 2021-06-05 | Discharge: 2021-06-05 | Disposition: A | Discharge status: Home / Self Care | Payer: Self-pay

## 2021-06-17 ENCOUNTER — Inpatient Hospital Stay: Admit: 2021-06-17 | Discharge: 2021-06-17 | Disposition: A | Discharge status: Home / Self Care | Payer: Self-pay

## 2021-06-27 ENCOUNTER — Inpatient Hospital Stay: Admit: 2021-06-27 | Discharge: 2021-06-27 | Disposition: A | Discharge status: Home / Self Care | Payer: Self-pay

## 2021-06-28 ENCOUNTER — Inpatient Hospital Stay: Admit: 2021-06-28 | Discharge: 2021-06-28 | Disposition: A | Discharge status: Home / Self Care | Payer: Self-pay

## 2021-07-02 ENCOUNTER — Other Ambulatory Visit: Payer: Self-pay

## 2021-07-03 ENCOUNTER — Other Ambulatory Visit: Payer: Self-pay

## 2021-07-08 ENCOUNTER — Encounter: Payer: Self-pay | Admitting: Family Medicine

## 2021-07-08 ENCOUNTER — Encounter: Payer: Self-pay | Admitting: Primary Care

## 2021-07-08 ENCOUNTER — Other Ambulatory Visit: Payer: Self-pay

## 2021-07-08 NOTE — Progress Notes (Signed)
Visiting nurse also stated foley will also be changed monthly.  Patient left leg is amputated to his left hip. Patient is receiving PT, OT, and skilled nursing.

## 2021-07-08 NOTE — Progress Notes (Signed)
Medication reconciliation    Patient was apparently prescribed morphine and oxycodone but not on the PMP. Maybe patient did not pick it up yet. This medication list was from a visiting nurse.     This report was requested by: [facility]   Reference #: [id number]    Prescriptions Dispensed in [place]  Others' Prescriptions  Patient Name: Craig Gilbert Date: 10/22/1955  Address: [address] [place] [address]Sex: Male  Rx Written Rx Dispensed Drug Strength Quantity Days Supply Prescriber Name Payment Method Dispenser  07/04/2021 07/04/2021 CLONAZEPAM 1 MG TABLET  7.0 4 R[place], [place]  07/04/2021 07/04/2021 GABAPENTIN 400 MG CAPSULE  180.0 30 ROTUNO, [place]

## 2021-07-08 NOTE — Patient Instructions (Signed)
Your Care Team      Phone:  [phone]  Fax:  [phone] or [phone]    Physician:  Adrienne Victor, MD  Nurse Practitioner: Scott Cody, NP  Nurse:   Diesel Lina, RN  Social Worker:  Wanda Garren  Pharmacist:  Michelle Phillips    You can reach us at [phone].  Please be prepared to let the secretary know why you are calling.  This will help let your nurse know how urgent the call is.  We will return your call as soon as possible, on clinic days it may take us longer.  After hours, weekends, and holidays the phone is answered by the answering service.  They will take a message and forward it to the doctor on-call who can access your chart and assist you.    Please call this same number if you need to cancel or re-schedule your appointment.    For [place] patient billing questions, please contact [phone] and your call will be directed to the appropriate person.    Please allow 5 days to re-fill any prescriptions and 5-10 days to complete disability or FMLA paperwork.

## 2021-07-09 ENCOUNTER — Other Ambulatory Visit: Payer: Self-pay

## 2021-07-09 ENCOUNTER — Other Ambulatory Visit
Admission: RE | Admit: 2021-07-09 | Discharge: 2021-07-09 | Disposition: A | Discharge status: Home / Self Care | Payer: Medicaid Other | Source: Ambulatory Visit | Attending: Pathology | Admitting: Pathology

## 2021-07-09 DIAGNOSIS — C4022 Malignant neoplasm of long bones of left lower limb: Secondary | ICD-10-CM | POA: Insufficient documentation

## 2021-07-09 NOTE — Telephone Encounter (Signed)
error 

## 2021-07-10 ENCOUNTER — Other Ambulatory Visit: Payer: Self-pay | Admitting: Primary Care

## 2021-07-10 LAB — SURGICAL PATHOLOGY

## 2021-07-10 NOTE — Telephone Encounter (Signed)
Rx Written Rx Dispensed Drug Quantity Days Supply Prescriber Name Prescriber [place] # Payment Method Dispenser     03/05/2021 03/05/2021 clonazepam 1 mg tablet  [address] [id number] Insurance [facility] [phone]

## 2021-07-18 ENCOUNTER — Encounter: Payer: Self-pay | Admitting: Gastroenterology

## 2021-07-21 ENCOUNTER — Other Ambulatory Visit
Admission: RE | Admit: 2021-07-21 | Discharge: 2021-07-21 | Disposition: A | Discharge status: Home / Self Care | Payer: Medicaid Other | Source: Ambulatory Visit | Attending: Pathology | Admitting: Pathology

## 2021-07-21 ENCOUNTER — Telehealth: Payer: Self-pay | Admitting: Primary Care

## 2021-07-21 DIAGNOSIS — C414 Malignant neoplasm of pelvic bones, sacrum and coccyx: Secondary | ICD-10-CM | POA: Insufficient documentation

## 2021-07-21 DIAGNOSIS — C419 Malignant neoplasm of bone and articular cartilage, unspecified: Secondary | ICD-10-CM

## 2021-07-21 NOTE — Telephone Encounter (Signed)
Patient called to relay that his catheter is causing him pain that brings him to tears. He said he is unable to contact urology. He wanted to know if there was anything our office could do for him.

## 2021-07-22 ENCOUNTER — Telehealth: Payer: Self-pay | Admitting: Urology

## 2021-07-22 ENCOUNTER — Other Ambulatory Visit: Payer: Self-pay

## 2021-07-22 ENCOUNTER — Ambulatory Visit: Payer: Medicaid Other | Attending: Hematology and Oncology | Admitting: Hematology and Oncology

## 2021-07-22 ENCOUNTER — Telehealth: Payer: Self-pay | Admitting: Hematology and Oncology

## 2021-07-22 ENCOUNTER — Encounter: Payer: Self-pay | Admitting: Gastroenterology

## 2021-07-22 ENCOUNTER — Ambulatory Visit: Payer: Medicaid Other | Admitting: Orthopedic Surgery

## 2021-07-22 ENCOUNTER — Encounter: Payer: Self-pay | Admitting: Hematology and Oncology

## 2021-07-22 ENCOUNTER — Encounter: Payer: Self-pay | Admitting: Family Medicine

## 2021-07-22 VITALS — BP 120/68 | HR 98 | Temp 97.5°F | Resp 18 | Wt 221.0 lb

## 2021-07-22 DIAGNOSIS — C419 Malignant neoplasm of bone and articular cartilage, unspecified: Secondary | ICD-10-CM | POA: Insufficient documentation

## 2021-07-22 DIAGNOSIS — Z89612 Acquired absence of left leg above knee: Secondary | ICD-10-CM | POA: Insufficient documentation

## 2021-07-22 LAB — SURGICAL PATHOLOGY

## 2021-07-22 MED ORDER — MORPHINE SULFATE ER 30 MG PO TBCR *I*
30.0000 mg | ORAL_TABLET | Freq: Two times a day (BID) | ORAL | 0 refills | Status: DC
Start: 2021-07-22 — End: 2021-08-11

## 2021-07-22 MED ORDER — MORPHINE SULFATE 30 MG PO TABS *I*
30.0000 mg | ORAL_TABLET | Freq: Two times a day (BID) | ORAL | 0 refills | Status: DC
Start: 2021-07-22 — End: 2021-07-22

## 2021-07-22 MED ORDER — MORPHINE SULFATE 15 MG PO TABS *I*
7.5000 mg | ORAL_TABLET | ORAL | 0 refills | Status: AC | PRN
Start: 2021-07-22 — End: ?

## 2021-07-22 NOTE — Telephone Encounter (Signed)
Mychart message sent to patient asking who his urologist is and suggested he contact his visiting nurse in the meantime.

## 2021-07-22 NOTE — Telephone Encounter (Signed)
Craig Gilbert has a question in regards to Morphine script she received. Please call

## 2021-07-22 NOTE — Telephone Encounter (Signed)
Can we try to reach the urology office regarding this.

## 2021-07-22 NOTE — Telephone Encounter (Signed)
Tried calling to check on patient but his voicemail was full, unable to leave a message.

## 2021-07-22 NOTE — Telephone Encounter (Signed)
It appears Scientist, water quality, Museum/gallery conservator called the urologist office requesting a call back.

## 2021-07-22 NOTE — Progress Notes (Signed)
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      HPI:  Craig Gilbert presents to the [facility] today in collaboration with Dr. Christy Sartorius. He is referred to [facility] to establish care from Dr. Karen Chafe of [facility] for diagnosis of chondrosarcoma status post hemipelvectomy for left proximal femur and intraarticular chondrosarcoma.  He was pleased with the care he was receiving however due to insurance coverage required to switch. He     Current cancer treatments: Tibsovo/ivosidenib  Current pain medications: Morphine BID for chronic pain related to cancer and prescribed gabapentin however infrequently takes this for phantom pain of the amputated left lower extremity.     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    Surgery/Date: 05/22/2021  1) Left External Hemipelvectomy Wound Debridement with Small Pocket of Seroma Decompression  2) Placement of Fibrillar Impregnated ABx Sponges  3) Placement of Conventional + Incisional Wound VAC, >50cm2, Durable Medical Device    Surgery/Date: 04/20/2021  1) Left External Hemipelvectomy  2) Left Posterior Gluteus Maximus Neurovascular Pedicle Flap use in order to Reconstruct Left Hemipelvis/Trunk Defect  3) Placement of Incisional Wound VAC, <50cm2, Durable Medical Device, Incisional Technique    Exam  BP 120/68    Pulse 98    Temp 36.4 C (97.5 F)    Resp  18    Wt 100.2 kg (221 lb)    SpO2 95%    BMI 28.36 kg/m   Constitutional: alert and oriented, appears stated age  Neuro Psych: Affect normal, appropriate mood  Focused musculoskeletal exam of the left lower extremity: The incision is healing well without any concerns for local infection.  No sutures remain.  No scabbing remains.  The scar is maturing. No palpable masses in the pelvis.      Imaging:  Prior imaging exams reviewed - metastatic disease to the chest.      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 Christy Sartorius.  - continue with surveillance scans per NCCN guidelines.

## 2021-07-22 NOTE — Telephone Encounter (Signed)
Per Dr. Christy Sartorius - new prescriptions sent for long acting and short acting morphine.

## 2021-07-22 NOTE — Progress Notes (Signed)
New Patient H&P    Patient ID: Craig Gilbert is a 65 y.o. male     Diagnosis:  Metastatic chondrosarcoma, poorly differentiated     Current treatment: ivosidenib 547m po qd started 06/29/21 (IDH1 inhibitor)    Prior treatment:   -- [facility]    Pertinent comorbidities:  -- urethral stricture and indwelling foley catheter     Referred by: MLarene Beach *    History of Present Illness:  HPI  Craig BUFKINis a 65y.o. male who comes to clinic for multidisciplinary assessment and ongoing care of metastatic chondrosarcoma.  Interval history as outlined below; briefly he had [facility] (11.4) [facility].  He had had prior CT scans 3 months prior without evidence of malignancy.  At time of surgery he had small but suspicious lung nodules (known prior to hemipelvectomy) with notable progression on repeat ct chest 6 weeks then slight further progression 1 month later.  [facility]  NGS profiling done and with IDH1 mutation, and he was started on ivosidenib there 06/29/21.  He had a prolonged stay and essentially inpatient rehab due to insurance difficulties, [facility]    He is accompanied by his nephew (sister's son) and nephew's wife.  Ortho onc also saw today, appreciate their coordination, incision site well healed.  Craig Gilbert's biggest concern is ongoing foley; he finds it very uncomfortable.  I will place urology referral and we did give him some supplies to try to help hold the tubing in a more comfortable position.  It looks like he did previously see urology Dr. ZLaddie Aquasand was diagnosed with a urinary stricture, and had urethromy 03/14/21, but continued to have urinary retention after requiring foley re-replacement 03/22/21.  It is not clear to me from notes if further  attempts/evaluation was done subsequently.      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       On ROS, he did fall yesterday, got tangled up in the wheels for his chair.  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 Currently getting around with a walker.  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    He denies overt side effects from ivosidenib.  We did check EKG today with normal qtc.  I did place labs, will ask him to get these after our visits.    He has phantom limb pain and as I understand it chronic back pain.  He was on oxycodone in the past for the back; has been on morphine 367mER currently for pain.  I will continue this; I will add on a prn IR morphine, stressed that he should truly use this "if needed" not max it out.  For more chronic type pain may note needing escalating doses of opiates for relief, which can lead to greater side effects and problems without major net benefit; if pain continues to be a major factor (particularly if not acute oncologic pain) consider palliative care referral in the future.  For now we will try to manage if needs are pretty stable.  He is on clonazepam chronically  for anxiety through PCP as well; we will differ to PCP for now, stressed that he should not take multiple sedating medications at the same time particularly if they are new/he is not used to them.  He is also appropriately on gabapentin and duloxetine.    I am concerned about reports of stopping insulin though would like to check current lab values first as well as A1c, and then should confer further with PCP.      He used to smoke, quit 3 months ago  / with diagnosis. .     Family history:  Notes extensive family history of cancer  Grandfather, father, grandmother - all with cancer, uncles  Has a brother and  a sister, brother had leukemia    [place]orts [place]     We did touch on prognosis; given prior fairly rapid growth of disease (chrondrosarcoma can range from slow growing to fast; possible undifferentiated component on path concerning for more aggressive disease) I think updating a CT chest to evaluate efficacy of ivosidenib fairly soon is in order and Craig Gilbert very much would like to do this as well.  If stable consider repeat q3 months.  Shrinkage on ivosidenib is unlikely based on literature to date though of course would be welcome.  If progressing, consider ipi 1 / nivo 3, and then pazopanib.  Usually chemotherapy not used for chondrosarcoma though will have to evaluate undifferentiated portion more; may be considered for dedifferentiated chondrosarcoma.      They were interested in reviewing MOLST and I gave a form for them to consider; perhaps the most important one being whether he would want to be DNR/DNI now.  I don't have a strong recommendation on that currently as we are trying for treatment and he is otherwise pretty motivated with good performance status considering what he has been through right now.  I did discuss goals of care; currently we are on "palliative" or "control and containment" intent with anticancer treatment; if we run out of good options, we might recommend best supportive care (if there are other problems to still treat) or else comfort care, which is often done with hospice.  It may be worth considering what he might want if/when hospice becomes appropriate; while I do hope we can keep this at [place] for a while (years?  Again, will know more with updated scans) I think it's best to have these conversations well in advance, rather than waiting for it to be an urgent need, especially since they can sometimes take some planning.    They mentioned possibly wanting [facility] in [place] if/when possibly needing to transition.     Regarding data for  ivosidenib:  Phase I Study of the Mutant IDH1 Inhibitor Ivosidenib: Safety and Clinical Activity in Patients With Advanced Chondrosarcoma  Lendon Ka. Tap, Marcha Solders, Jeronimo Greaves. [place], Blanca Friend, Filip Fa[place], [place], Murali El Mangi, Lenard Lance, [place], [place], [place], [place], Camelia Hiouchi, [place], Sam Volo, Shuchi S. Earley Brooke, and Pace C. Prescott of Clinical Oncology 2020 38:15, [phone]   https://ascopubs.org/doi/full/10.1200/JCO.19.02492#:~:text=In%20patients%20with%20chondrosarcoma%2C%20ivosidenib%20showed%70mnimal%20toxicity%2C,chondrosarcoma.%20Chondrosarcomas%20are%20rare%20primary%20malignancies%20of%20bone.    Safety:   The most common treatment-emergent AEs in the chondrosarcoma cohort (Table 2) were mostly grade 1 or 2. Of the grade ? 3 AEs in 12 patients (57.1%; Data Supplement), only one was judged to be treatment related by the investigator (hypophosphatemia, n = 1). One patient had an AE leading to permanent drug discontinuation (  hydronephrosis at 500 mg), which was not considered treatment related, and two had AEs leading to drug hold (anemia at 400 mg and confusion at 500 mg, respectively). There were no AEs leading to dose reductions. Grade 1 or 2 ECG QT prolongation occurred in five patients (23.8%); there were no grade ? 3 events, and no dose modifications were required for QT prolongation. There were two deaths during treatment resulting from serious AEs: acute respiratory failure at 400 mg and respiratory failure at 500 mg, respectively, both considered unrelated to study treatment by the investigator.    Efficacy:  All 21 patients with chondrosarcoma were included for efficacy evaluation; 11 (52%) experienced SD as the best overall response by RECIST, six (29%) had PD, one (5%) had a response of unknown, and three (14%) were not assessed. There were no CRs or PRs. As of the data cutoff, median PFS for all patients (n = 21) was 5.6 months  (95% CI, 1.9 to 7.4 months), with 3- and 41-monthPFS rates of 62.1% (95% CI, 36.4% to 79.9%) and 39.5% (95% CI, 17.9% to 60.6%), respectively (Table 3; Data Supplement). Although the number of patients with dedifferentiated histology was small (n = 6), their outcomes were poor, with only 30% of patients progression free at 3 months and none by 6 months.     Oncology History Overview Note   11/19/20: [facility] lesions.    [facility]21[facility] [facility]  03/14/21: cystoscopy with internal urethromy Dr. ZLaddie Aquasand foley placement  03/19/21: [facility], [facility], L [facility] pain, Imaging concerning for L hip abscess and osteomyelitis.  CT a/p with new fluid collection adjacent to proximal L femur/lesser chrocanter, though tot be possible bursiitis or abscess.  New compared with prior scans from 12/16/20.  03/22/21: CT L femur with contrast at [place] "again seen is multiloculated fluid collection surrounding the left proximal femur, measuring 11.4 cm x 10.1 cm x 13.4 cm. This may represent fluid within bursae. Other etiologies, including infection, are not excluded. There is no significant adjacent fat stranding. There is no disruption of the underlying cortex. Noted to have urinary retention 10052mand foley catheter reinserted as well.    03/24/21: MRI L femur/thigh w/wo contrast "Pattern consistent with femoral osteomyelitis and possible periosteal abscesses, consider aspiration for definitive diagnosis" Noted to be limited by motion artifact.  Bilateral lower extremity ultrasound same day also with partial, nonocclusive DVT in the left common femoral vein.  03/25/21 - 04/02/21: [facility]non oncologic procedure[facility]  [facility] with chondrosarcoma grade 2 of 3.  Patient was discharged to rehab and developed  femoral neck fracture through chrondrosarcoma.    04/14/21: CT L hip and femur with contrast with Interval increase in size of the complex periarticular fluid collections adjacent to the proximal femur.  Interval development of a subacute pathologic displaced fracture at the basicervical femoral neck with posterior displacement and varus angulation at the fracture site.  04/18/21: CT chest with scattered indeterminate pulmonary nodules bilaterally up to 85m18mRLL)   04/20/21 - 07/04/21: [facility]   04/22/21: infrarenal IVC filter placement   04/24/21:  Patient elected to proceed with left External Hemipelvectomy (CPT 27290), Left Posterior Gluteus Maximus Neurovascular Pedicle Flap use in order to Reconstruct Left Hemipelvis/Trunk Defect   05/22/21:  Debridement and wound vac change (Left External Hemipelvectomy Wound Debridement with Small Pocket of Seroma Decompression, Placement of Fibrillar Impregnated ABx Sponges, Placement of Conventional + Incisional Wound VAC, >50cm2, Durable Medical Device)  06/02/21: Debridement and wound vac change [1) Left External Hemipelvectomy Wound Debridement with Small Pocket of Seroma Decompression, Primary Closure of 15cm of Skin and adipose tissue, along with deep fascia, Placement of Incisional Wound VAC, >50cm2, Durable Medical Device (CPT [phone]), Foley Catheter was exchanged by the Urology team for a Urethral stricture]  06/05/21: CT chest with interval development of multiple nodules in both lungs.  The previously noted right lower lobe nodule has increased in size   06/16/21: Debridement and wound vac change [1) Left External Hemipelvectomy Wound Debridement with Small Pocket of Seroma Decompression , Placement of Conventional Wound VAC, >50cm2, Durable Medical Device ]  06/19/21: Debridement and wound complex closure and wound vac placement [Excisional debridement of left hip wound 15x9 cm2 in preparation for closure, Complex  closure of left hip wound 15 cm of length, Wound vac placement (non-disposable) < 50cm2 ]  06/28/21: CT chest the nodules appear stable to slightly increased in size since the exam of 09/01. For example, a solid nodule in the left lower lobe (4:148) measures 29 x 24 mm on today's exam versus 20 x 16 mm on the prior study. A 16 x 13 mm LUL indeterminate nodule (4:50) measured 13 x 10 mm on the prior exam. An 11 x 11 mm RLL nodule (4:100) measured 9 x 7 mm on the prior exam.  Also interval development of groundglass and reticular opacities and small pleural effusions.  06/29/21: started on ivosidenib (IDH1 inhibitor) as inpatient with Dr. Calla Kicks given reported Craig Gilbert mutation per [facility]       Chondrosarcoma   04/18/2021 Initial Diagnosis    Chondrosarcoma     06/29/2021 -  Chemotherapy    OP AML Ivosidenib (Tibsovo)  Plan Provider: Dorthula Nettles, MD  Treatment goal: Remission  Line of treatment: [No plan line of treatment]         Past Medical History:   Diagnosis Date    Anxiety     Arthritis     DM (diabetes mellitus)     High cholesterol     HTN (hypertension)     Hydronephrosis     Nerve pain     PE (pulmonary thromboembolism)     Restless leg syndrome     Sciatica     Urethral stricture     Urinary retention      Past Surgical History:   Procedure Laterality Date    PR CYSTOURETHROSCOPY N/A 01/07/2021    Procedure: urethroscopy;  Surgeon: Lowella Dandy, MD;  Location: [facility] MAIN OR;  Service: Urology    PR CYSTOURETHROSCOPY N/A 03/14/2021    Procedure: CYSTOSCOPY with  internal uretheromy;  Surgeon: Lowella Dandy, MD;  [facility];  Service: Urology    TONSILLECTOMY AND ADENOIDECTOMY  1969       No family history on file.    No Known Allergies (drug, envir, food or latex)    Outpatient Medications Marked as Taking for the 07/22/21 encounter (Office Visit) with Dorthula Nettles, MD   Medication Sig Dispense Refill    acetaminophen (TYLENOL) 500 mg tablet Take 500 mg by  mouth every 6 hours as needed      [DISCONTINUED] morphine 30 MG immediate release tablet Take 1 tablet (30 mg total) by mouth 2 times daily  Max daily dose: 60 mg 60 tablet 0    clonazePAM (KLONOPIN) 1 mg tablet TAKE 1 TABLET(1 MG) BY MOUTH TWICE DAILY. MAX DAILY DOSE: 2 MG 60 tablet 0    ivosidenib (TIBSOVO) 250 mg tablet Take 500 mg by mouth daily      naloxone (NARCAN) 4 mg/0.1 mL nasal spray SMARTSIG:Both Nares      Alcohol Swabs (ALCOHOL PREP) 70 % PADS Apply 1 each topically 3 times daily      baclofen (LIORESAL) 10 mg tablet Take 1 tablet by mouth 3 times daily      DULoxetine (CYMBALTA) 20 mg DR capsule Take 20 mg by mouth 2 times daily      ERGOCALCIFEROL 1.25 MG (50000 UT) capsule Take 1 capsule by mouth once a week      GABAPENTIN 400 MG capsule Take 2 capsules by mouth 3 times daily      [DISCONTINUED] morphine 30 MG immediate release tablet Take 30 mg by mouth 2 times daily      tamsulosin (FLOMAX) 0.4 mg capsule Take 1 capsule (0.4 mg total) by mouth every evening  for Urinary Tract Stones 30 capsule 0    lisinopril (PRINIVIL,ZESTRIL) 5 mg tablet TAKE 1 TABLET BY MOUTH DAILY 90 tablet 1    pramipexole (MIRAPEX) 1 MG tablet Take 1 tablet (1 mg total) by mouth 3 times daily 90 tablet 5    atorvastatin (LIPITOR) 10 mg tablet Take 10 mg by mouth daily         Social History     Socioeconomic History    Marital status: Married     Spouse name: Not on file    Number of children: Not on file    Years of education: Not on file    Highest education level: Not on file   Occupational History    Not on file   Tobacco Use    Smoking status: Former Smoker     Years: 57.00     Types: Cigarettes     Start date: 1964    Smokeless tobacco: Never Used   Substance and Sexual Activity    Alcohol use: Not Currently    Drug use: Yes     Types: Marijuana    Sexual activity: Not on file   Social History Narrative    Not on file         Physical Examination:  Vitals:    07/22/21 1314   BP: 120/68    Pulse: 98   Resp: 18   Temp: 36.4 C (97.5 F)   Weight: 100.2 kg (221 lb)     ECOG PERFORMANCE STATUS: (1) Restricted in physically strenuous activity, ambulatory and able to do work of light nature  Gen: No acute distress, nontoxic appearance, alert and oriented x3  HEENT: normocephalic, no scleral icterus, extraocular movements intact, moist mucus membranes.    CV: regular rate and rhythm, no murmurs/gallops/rubs  Pulm: clear to ascultation bilaterally, unlabored.  No wheezes or ronchi.  GI: Abdomen soft, nontender, nondistended, bowel sounds positive.     Extremities: no R lower extremity edema, +L extremity surgically absent.  L pelvic scar all closed, no scabs.  +Foley catheter in place with bag.    Onc: no lymphadenoapthy, no bruising  Skin: no rashes  Neuro: Grossly nonfocal    Lab Results:       Lab results: 04/17/21  0552 04/16/21  0612 04/15/21  0518   WBC 5.0 4.4 4.5   Hemoglobin 11.0* 11.8*  10.1*   Hematocrit 33* 34* 30*   RBC 3.7* 3.9* 3.4*   Platelets 259 279 212   Neut # K/uL 3.4 2.5 3.2   Lymph # K/uL 0.9* 1.1* 0.7*   Mono # K/uL 0.5 0.4 0.4   Eos # K/uL 0.1 0.3 0.2   Baso # K/uL 0.0 0.0 0.0   Seg Neut % 68.4 57.0 69.4   Lymphocyte % 18.8 24.4 15.4   Monocyte % 9.2 10.1 9.3   Eosinophil % 2.8 7.1 4.8   Basophil % 0.4 0.7 0.7               Lab results: 04/17/21  0552 04/16/21  0612 04/15/21  0518 01/02/21  1233 11/19/20  0852   Sodium 137 137 137   < > 132*   132*   132*   Potassium 3.5 3.8 3.6   < > 4.1   4.1   4.1   Chloride 104 106 106   < > 96*   96*   96*   CO2 22 19* 22   < > 25   25   25    UN 14 12 16    < > 18   18   18    Creatinine 0.82 0.73 0.78   < > 1.1   1.1   1.1   GFR,Caucasian  --   --   --   --  67.83   67.83   67.83   GFR,Black  --   --   --   --  82.07   82.07   82.07   Glucose 93 102* 154*   < > 321*   321*   321*   Calcium 9.3 9.1 8.3*   < > 9.2   9.2   9.2   Total Protein 6.8 7.1 6.0*   < > 7.2   7.2   7.2   Albumin 3.4* 3.5 3.0*   < > 4.1   4.1   4.1   ALT 12 12 10    < > 14    14   14    AST 15 15 19    < > 12   12   12    Alk Phos 109 116 102   < > 143*   143*   143*   Bilirubin,Total 0.4 0.4 0.4   < > 0.4   0.4   0.4    < > = values in this interval not displayed.         No results for input(s): LD in the last 8760 hours.  No results for input(s): C1992, CEA3 in the last 8760 hours.      Lab results: 11/19/20  0852   PSA (eff. 01-2009) 1.06       No results for input(s): VID25 in the last 8760 hours.  No results for input(s): PO4 in the last 8760 hours.    Imaging:  No results found.      Pathology:   [phone]   Additional Copy to:   [place]     FINAL DIAGNOSIS:   Outside material received from [place], [place] for   consultation.     [phone], 03/26/2021):   Bone and soft tissue, left hip, biopsy:   -Chondrosarcoma, grade 2 of 3.   MICROSCOPIC EXAMINATION: Performed     CLINICAL INFORMATION:   Outside material received for review prior to patient's next   appointment date/time: 07/22/2021     [id number]  Additional Copy to:   [facility]     FINAL DIAGNOSIS:   Outside material received from [facility], [place], [place] for   consultation.     [phone], 06/02/2021):   Lower extremity, left leg and hemipelvis, external hemipelvectomy and   leg amputation:   - Chondrosaroma.    - Procedure: Radical resection.       - Tumor site: Femur.    - Tumor size: 11.7 cm in greatest dimension (grossly).       - Tumor location and extent: Proximal femur and inferior       pelvis, extending into the surrounding fascia.    - Histologic type: Chondrosarcoma.    - Histologic grade: G3, poorly differentiated, high grade.       - Mitotic rate: up to 9 mitoses per 10 high-power fields.    - Necrosis: Present, 10%.    - Treatment effect: No known presurgical therapy.    - Lymphovascular invasion: Not identified.    - Margins: All margins negative for tumor.       - Regional lymph node status: No malignancy identified in five        lymph nodes (0/5) (per outside report, see comment).    - Pathologic stage: pT2pN0     Comment: Per outside report, all five lymph nodes were negative for   malignancy, however, only 31 of the 32 slides were provided for   evaluation, the missing slide, A32-representative sections from one   possible lymph node, was not available for examination. The four lymph   nodes examined are negative for malignancy.       Assessment:   Craig Gilbert is a 65 y.o. male with metastatic chondrosarcoma, per path undifferentiated component, IDH1 mutant.  Ivosidinib 526m po [facility]; will request ct chest next available to assess for stability on ivosidinib, previously relatively rapidly progressive.  He is tolerating ivosidinib well.  Will also request PD-L1 staining, consider ipi 1 / nivo 3 as next line of therapy if/when needed.  [email or web address] showed response in 1 of 5 patients with chrondrosarcoma (20%), [email or web address], and alliance trial did show overall higher response rates in soft tissue sarcomas to dual checkpoints ipi 1 /nivo 3 x4 than single agent PD-L1.      Stage: IV  Chemotherapy/treatment: ivosdidenib 5027mpo qd  Intent: control and containment    Recommendations:  Chrondrosarcoma:  -- continue ivosidinib 50045mo qd  -- requested hep profile, CBC, CMP, uric acid, LDH, CK, phos  -- ekg checked today, qtc normal  -- also requested a1c   -- ct chest next available     Urethral stricture:   -- underwent internal urethromy Dr. ZahLaddie Aquas10/22, noted to be retaining over 1000m21m18 and as I understand it has had foley since  -- foley bag supplies given today  -- referral to urology    History DVT:  -- on now-amputated leg, nonocclusive, noted 03/24/21  -- [facility]  -- will follow up IVC filter at next imaging, discuss possible removal  -- currently not on anticoagulation, given removal of leg  with DVT do not see that we need to start this now    Symptom control:  Pain:  -- refilled morphine 30mg60mBID  -- wrote for morphine 7.5 mg - can take 1-2 tabs q6 hrs prn, try to use sparing for chronic pain (ie, phantom pain)  -- PCP  has written for gabapentin, duloxetine, also chronically on clonazepam  -- consider palliative care referral in the future if not adequately controled on this    Follow up: with CT chest    The patient expressed an understanding and agreement with the plan. All questions were answered. The patient knows to call our office with any concerns or questions should they arise.     Dorthula Nettles, MD   [facility]   Division of Hematology/Oncology   Department of Medicine

## 2021-07-22 NOTE — Telephone Encounter (Signed)
Copied from [place] #[id number]. Topic: Access to Care - Speak to Provider/Office Staff  >> Jul 22, 2021  3:57 PM Penni Bombard wrote:  [facility] uould like a call back to discuss Craig Gilbert's catheter she can be reached at  [phone]

## 2021-07-22 NOTE — Progress Notes (Signed)
Home heath certification and plan of care reviewed.  Certification dates from 07/07/2021 through 09/04/2021.  Care to include medication assistance, blood glucose testing assistance, and catheter care.    I agree that the patient needs ongoing services as indicated.    Zannie Cove, DO   07/22/2021

## 2021-07-23 ENCOUNTER — Telehealth: Payer: Self-pay | Admitting: Hematology and Oncology

## 2021-07-23 NOTE — Telephone Encounter (Signed)
Spoke with [employer] and she will fax [place] the orders to be signed.

## 2021-07-23 NOTE — Telephone Encounter (Signed)
Calling [facility] re: above.  Contact understands and agrees with this plan (above).  No further questions at this time.

## 2021-07-23 NOTE — Telephone Encounter (Signed)
Attempting to return call to [facility].  Per Dr. Christy Sartorius, active morphine prescriptions include 1/2 tablet of 15 mg immediate relief every 4 hours and whole tablet of 30 mg 12 hour tablet q12 hours.      Line unavailabile.  Will attempt again at a later time.

## 2021-07-23 NOTE — Plan of Care (Signed)
START OFF PATHWAY REGIMEN - Sarcoma            Ivosidenib (Tibsovo)     **Always confirm dose/schedule in your pharmacy ordering system**    Patient Characteristics:  Other Sarcoma Subtypes, Other Histology  Histology/Anatomic Site: Other Histology  Histology being treated: chrondrosarcoma, IDH1 mutated  Intent of Therapy:  Non-Curative / Palliative Intent, Discussed with Patient

## 2021-07-23 NOTE — Addendum Note (Signed)
Addended by: Dorthula Nettles on: 07/23/2021 04:14 PM     Modules accepted: Level of Service

## 2021-07-24 DIAGNOSIS — C419 Malignant neoplasm of bone and articular cartilage, unspecified: Secondary | ICD-10-CM

## 2021-07-24 DIAGNOSIS — F419 Anxiety disorder, unspecified: Secondary | ICD-10-CM

## 2021-07-24 NOTE — Progress Notes (Addendum)
[facility] SOCIAL WORK:   [facility] Social Work Therapist, nutritional:     Patient referred based on distress screen?: Yes      Social Work contact made?: Yes      Method of contact:  Telephone    Other method of contact:  Patient [phone]    Social Work Assessment:  Adjustment to Diagnosis, Financial and Barriers to Care    Barriers to care:  Lodging    Social Work post assessment plan:  Referral  Referral:     Paediatric nurse:  [place] Program: [place] support:  [place] and Patient [place]:  Yes    [place]: Caregiver Support Group    Time spent on this encounter (in minutes):  45    Patient referred to social work by [employer], Therapist, sports for psychosocial support.  Medical record reviewed and telephone call placed to patient.  I provided an overview regarding the role of social work and my contact information.    Patient endorsed significant financial distress. He has incurred extensive medical bills at [facility], [employer] and [facility].  He is worried about potential medical expenses through [facility].  I provided an overview about the [place] application process, eligibility and income verification.  An application has been mailed to him for review and completion.    Given his financial distress and distance travelled to appointments I have also requested assistance from the Patient Needs Fund to be available for pick up at his next appointment.      In terms of coping the patient endorsed difficulty with physical limitations as a result of surgery and pain that impacts his quality of life.  He expressed anger at local providers who he feels "misdiagnosed him" resulting in his current situation.  He is also frustrated with his insurance that did not cover his medical bills for at the [facility].  He requests a referral to a therapist to help him  with coping strategies.  This referral has been initiated.      He is married and I have also mailed a flyer for the [place] for his spouse.      The patient has home care and inquired about obtaining equipment like a newer/adjustable hospital bed.  I encouraged him to discuss equipment needs with his home nurse and therapist.    We also discussed local lodging since patient travels a distance.  I explained the referral process and services available at [facility].  He will consider this for future needs.    Referrals as per above have been initiated.  Patient denied additional psychosocial concerns at this time but agreed to reach out to social work as needs arise.    Hazle Nordmann, LMSW, OSW-C  Senior Social Worker  [employer]  [phone]

## 2021-07-25 ENCOUNTER — Telehealth: Payer: Self-pay

## 2021-07-25 ENCOUNTER — Other Ambulatory Visit: Payer: Self-pay

## 2021-07-25 ENCOUNTER — Other Ambulatory Visit: Payer: Self-pay | Admitting: Hematology and Oncology

## 2021-07-25 ENCOUNTER — Ambulatory Visit
Admission: RE | Admit: 2021-07-25 | Discharge: 2021-07-25 | Disposition: A | Discharge status: Home / Self Care | Payer: Medicaid Other | Source: Ambulatory Visit | Attending: Hematology and Oncology | Admitting: Hematology and Oncology

## 2021-07-25 DIAGNOSIS — C419 Malignant neoplasm of bone and articular cartilage, unspecified: Secondary | ICD-10-CM | POA: Insufficient documentation

## 2021-07-25 DIAGNOSIS — C7801 Secondary malignant neoplasm of right lung: Secondary | ICD-10-CM | POA: Insufficient documentation

## 2021-07-25 DIAGNOSIS — C7802 Secondary malignant neoplasm of left lung: Secondary | ICD-10-CM | POA: Insufficient documentation

## 2021-07-25 MED ORDER — IOHEXOL 350 MG/ML (OMNIPAQUE) IV SOLN 500ML BOTTLE *I*
1.0000 mL | Freq: Once | INTRAVENOUS | Status: AC
Start: 2021-07-25 — End: 2021-07-25
  Administered 2021-07-25: 70 mL via INTRAVENOUS

## 2021-07-25 NOTE — Telephone Encounter (Signed)
Called to sch NPV in [place]. Left my name and [facility] number

## 2021-07-25 NOTE — Patient Instructions (Incomplete)
Your Care Team      Phone:  [phone]  Fax:  [phone] or [phone]    Physician:  Adrienne Victor, MD  Nurse Practitioner: Scott Cody, NP  Nurse:   Raima Geathers, RN  Social Worker:  Wanda Garren  Pharmacist:  Michelle Phillips    You can reach us at [phone].  Please be prepared to let the secretary know why you are calling.  This will help let your nurse know how urgent the call is.  We will return your call as soon as possible, on clinic days it may take us longer.  After hours, weekends, and holidays the phone is answered by the answering service.  They will take a message and forward it to the doctor on-call who can access your chart and assist you.    Please call this same number if you need to cancel or re-schedule your appointment.    For [place] patient billing questions, please contact [phone] and your call will be directed to the appropriate person.    Please allow 5 days to re-fill any prescriptions and 5-10 days to complete disability or FMLA paperwork.

## 2021-07-28 ENCOUNTER — Encounter: Payer: Self-pay | Admitting: Gastroenterology

## 2021-07-30 ENCOUNTER — Telehealth: Payer: Self-pay | Admitting: Hematology and Oncology

## 2021-07-30 ENCOUNTER — Ambulatory Visit: Payer: Medicaid Other | Admitting: Hematology and Oncology

## 2021-07-30 ENCOUNTER — Ambulatory Visit: Payer: Medicaid Other | Attending: Hematology and Oncology | Admitting: Hematology and Oncology

## 2021-07-30 DIAGNOSIS — C419 Malignant neoplasm of bone and articular cartilage, unspecified: Secondary | ICD-10-CM | POA: Insufficient documentation

## 2021-07-30 LAB — SURGICAL PATHOLOGY

## 2021-07-30 NOTE — Telephone Encounter (Signed)
Returning call to patient.  Per Dr. Christy Sartorius - video visit OK, patient appreciative as he is very fatigued today.  Will update team.

## 2021-07-30 NOTE — Progress Notes (Signed)
Medical Oncology follow up note  VIDEO VISIT    Patient ID: Craig Gilbert is a 65 y.o. male     Diagnosis:  Metastatic chondrosarcoma, poorly differentiated     Current treatment:   -- pursuing ipi 1 / nivo 3   -- ivosidenib 544m po qd started 06/29/21 (IDH1 inhibitor) 06/29/21 - current    Prior treatment:   -- [facility]    Pertinent comorbidities:  -- urethral stricture and indwelling foley catheter     Interval events:  I saw RLeisa Lenztoday on video visit to review scans.  CT chest with growing lung nodules compared with 06/28/21 (just prior to starting ivosidinib).  Unfortunately I would say ivosidinib is not working well enough.      I think it is worth trying immunotherapy;  [id number] response in 1 of 5 patients with chrondrosarcoma (20%), [email or web address](pembro)     [facility] showed superior response rates with ipi 1 / nivo 3   [email or web address]     He is amenable to trying a different line of treatment.  I discussed that this is generally IV infusions q3 weeks with chance of severe auto immune side effects, which can be highly variable in nature.  He does want to come to [place] for this.      On ROS:   Pain with deep breathing R chest   Feels very tired, weak     Under jaw R side, feels enlarged    Generally still very active driving, working.    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 male who comes to clinic for multidisciplinary assessment and ongoing care of metastatic chondrosarcoma.  Interval history as outlined below; briefly he had [facility] (11.4) cm [facility].  He had had prior CT scans 3 months prior without evidence of malignancy.  At time of surgery he had small but suspicious lung nodules  (known prior to hemipelvectomy) with notable progression on repeat ct chest 6 weeks then slight further progression 1 month later.  [facility]  NGS profiling done and with IDH1 mutation, and he was started on ivosidenib there 06/29/21.  He had a prolonged stay and essentially inpatient rehab due to insurance difficulties, [facility]    He is accompanied by his nephew (sister's son) and nephew's wife.  Ortho onc also saw today, appreciate their coordination, incision site well healed.  Lyric L Lafortune's biggest concern is ongoing foley; he finds it very uncomfortable.  I will place urology referral and we did give him some supplies to try to help hold the tubing in a more comfortable position.  It looks like he did previously see urology Dr. ZLaddie Aquasand was diagnosed with a urinary stricture, and had urethromy 03/14/21, but continued to have urinary retention after requiring foley re-replacement 03/22/21.  It is not clear to me from notes if further attempts/evaluation was done subsequently.      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       On ROS, he  did fall yesterday, got tangled up in the wheels for his chair.  He is working on Chief Executive Officer for a Administrator and lift for his truck. Currently getting around with a walker.  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    He denies overt side effects from ivosidenib.  We did check EKG today with normal qtc.  I did place labs, will ask him to get these after our visits.    He has phantom limb pain and as I understand it chronic back pain.  He was on oxycodone in the past for the back; has been on morphine 45m ER currently for pain.  I will continue this; I will add on a prn IR morphine, stressed that he should truly use this "if needed" not  max it out.  For more chronic type pain may note needing escalating doses of opiates for relief, which can lead to greater side effects and problems without major net benefit; if pain continues to be a major factor (particularly if not acute oncologic pain) consider palliative care referral in the future.  For now we will try to manage if needs are pretty stable.  He is on clonazepam chronically for anxiety through PCP as well; we will differ to PCP for now, stressed that he should not take multiple sedating medications at the same time particularly if they are new/he is not used to them.  He is also appropriately on gabapentin and duloxetine.    I am concerned about reports of stopping insulin though would like to check current lab values first as well as A1c, and then should confer further with PCP.      He used to smoke, quit 3 months ago  / with diagnosis. .     Family history:  Notes extensive family history of cancer  Grandfather, father, grandmother - all with cancer, uncles  Has a brother and a sister, brother had leukemia    [place]     We did touch on prognosis; given prior fairly rapid growth of disease (chrondrosarcoma can range from slow growing to fast; possible undifferentiated component on path concerning for more aggressive disease) I think updating a CT chest to evaluate efficacy of ivosidenib fairly soon is in order and RWILKES POTVINvery much would like to do this as well.  If stable consider repeat q3 months.  Shrinkage on ivosidenib is unlikely based on literature to date though of course would be welcome.  If progressing, consider ipi 1 / nivo 3, and then pazopanib.  Usually chemotherapy not used for chondrosarcoma though will have to evaluate undifferentiated portion more; may be considered for dedifferentiated chondrosarcoma.      They were interested in reviewing MOLST and I gave a form for them to consider; perhaps the most important one being whether he  would want to be DNR/DNI now.  I don't have a strong recommendation on that currently as we are trying for treatment and he is otherwise pretty motivated with good performance status considering what he has been through right now.  I did discuss goals of care; currently we are on "palliative" or "control and containment" intent with anticancer treatment; if we run out of good options, we might recommend best supportive care (if there are other problems to still treat) or else comfort care, which is often done with hospice.  It may be worth considering what he might want if/when hospice becomes appropriate; while  I do hope we can keep this at [place] for a while (years?  Again, will know more with updated scans) I think it's best to have these conversations well in advance, rather than waiting for it to be an urgent need, especially since they can sometimes take some planning.    They mentioned possibly wanting [facility] in [place] if/when possibly needing to transition.     Regarding data for ivosidenib:  Phase I Study of the Mutant IDH1 Inhibitor Ivosidenib: Safety and Clinical Activity in Patients With Advanced Chondrosarcoma  Lendon Ka. Tap, Marcha Solders, Jeronimo Greaves. Cote, Blanca Friend, Filip Jesup, Keener Mir, Murali Catharine, Lenard Lance, [place], [place], [employer], [place], Camelia Rudd, [place], Sam Gladstone, Shuchi S. Earley Brooke, and [place]. [place] of Clinical Oncology 2020 38:15, [phone]   https://ascopubs.org/doi/full/10.1200/JCO.19.02492#:~:text=In%20patients%20with%20chondrosarcoma%2C%20ivosidenib%20showed%51mnimal%20toxicity%2C,chondrosarcoma.%20Chondrosarcomas%20are%20rare%20primary%20malignancies%20of%20bone.    Safety:   The most common treatment-emergent AEs in the chondrosarcoma cohort (Table 2) were mostly grade 1 or 2. Of the grade ? 3 AEs in 12 patients (57.1%; Data Supplement), only one was judged to be treatment related by the investigator (hypophosphatemia,  n = 1). One patient had an AE leading to permanent drug discontinuation (hydronephrosis at 500 mg), which was not considered treatment related, and two had AEs leading to drug hold (anemia at 400 mg and confusion at 500 mg, respectively). There were no AEs leading to dose reductions. Grade 1 or 2 ECG QT prolongation occurred in five patients (23.8%); there were no grade ? 3 events, and no dose modifications were required for QT prolongation. There were two deaths during treatment resulting from serious AEs: acute respiratory failure at 400 mg and respiratory failure at 500 mg, respectively, both considered unrelated to study treatment by the investigator.    Efficacy:  All 21 patients with chondrosarcoma were included for efficacy evaluation; 11 (52%) experienced SD as the best overall response by RECIST, six (29%) had PD, one (5%) had a response of unknown, and three (14%) were not assessed. There were no CRs or PRs. As of the data cutoff, median PFS for all patients (n = 21) was 5.6 months (95% CI, 1.9 to 7.4 months), with 3- and 676-monthFS rates of 62.1% (95% CI, 36.4% to 79.9%) and 39.5% (95% CI, 17.9% to 60.6%), respectively (Table 3; Data Supplement). Although the number of patients with dedifferentiated histology was small (n = 6), their outcomes were poor, with only 30% of patients progression free at 3 months and none by 6 months.     Oncology History Overview Note   11/19/20: [facility] no L hip lesions.    01/07/21: [facility] stricture  03/14/21: cystoscopy with internal urethromy Dr. ZaLaddie Aquasnd foley placement  03/19/21: [facility]ed to [facility] fevers, chills, L hip pain, Imaging concerning for L hip abscess and osteomyelitis.  CT a/p with new fluid collection adjacent to proximal L femur/lesser chrocanter, though tot be possible bursiitis or abscess.  New compared with prior scans from  12/16/20.  03/22/21: CT L femur with contrast at [facility] "again seen is multiloculated fluid collection surrounding the left proximal femur, measuring 11.4 cm x 10.1 cm x 13.4 cm. This may represent fluid within bursae. Other etiologies, including infection, are not excluded. There is no significant adjacent fat stranding. There is no disruption of the underlying cortex. Noted to have urinary retention 100025mnd foley catheter reinserted as well.  03/24/21: MRI L femur/thigh w/wo contrast "Pattern consistent with femoral osteomyelitis and possible periosteal abscesses, consider aspiration for definitive diagnosis" Noted to be limited by motion artifact.  Bilateral lower extremity ultrasound same day also with partial, nonocclusive DVT in the left common femoral vein.  03/25/21 - 04/02/21: transferred to [facility] for I&D of presumed abscess (non oncologic procedure).  Subsequent pathology, reviewed at [facility] (also [place] [facility]) with chondrosarcoma grade 2 of 3.  Patient was discharged to rehab and developed femoral neck fracture through chrondrosarcoma.    04/14/21: CT L hip and femur with contrast with Interval increase in size of the complex periarticular fluid collections adjacent to the proximal femur.  Interval development of a subacute pathologic displaced fracture at the basicervical femoral neck with posterior displacement and varus angulation at the fracture site.  04/18/21: CT chest with scattered indeterminate pulmonary nodules bilaterally up to 33m (RLL)   04/20/21 - 07/04/21: [facility]   04/22/21: infrarenal IVC filter placement   04/24/21:  Patient elected to proceed with left External Hemipelvectomy (CPT 27290), Left Posterior Gluteus Maximus Neurovascular Pedicle Flap use in order to Reconstruct Left Hemipelvis/Trunk Defect   05/22/21: Debridement and wound vac change (Left External Hemipelvectomy Wound Debridement with Small Pocket of Seroma Decompression,  Placement of Fibrillar Impregnated ABx Sponges, Placement of Conventional + Incisional Wound VAC, >50cm2, Durable Medical Device)  06/02/21: Debridement and wound vac change [1) Left External Hemipelvectomy Wound Debridement with Small Pocket of Seroma Decompression, Primary Closure of 15cm of Skin and adipose tissue, along with deep fascia, Placement of Incisional Wound VAC, >50cm2, Durable Medical Device (CPT [phone], Foley Catheter was exchanged by the Urology team for a Urethral stricture]  06/05/21: CT chest with interval development of multiple nodules in both lungs.  The previously noted right lower lobe nodule has increased in size   06/16/21: Debridement and wound vac change [1) Left External Hemipelvectomy Wound Debridement with Small Pocket of Seroma Decompression , Placement of Conventional Wound VAC, >50cm2, Durable Medical Device ]  06/19/21: Debridement and wound complex closure and wound vac placement [Excisional debridement of left hip wound 15x9 cm2 in preparation for closure, Complex closure of left hip wound 15 cm of length, Wound vac placement (non-disposable) < 50cm2 ]  06/28/21: CT chest the nodules appear stable to slightly increased in size since the exam of 09/01. For example, a solid nodule in the left lower lobe (4:148) measures 29 x 24 mm on today's exam versus 20 x 16 mm on the prior study. A 16 x 13 mm LUL indeterminate nodule (4:50) measured 13 x 10 mm on the prior exam. An 11 x 11 mm RLL nodule (4:100) measured 9 x 7 mm on the prior exam.  Also interval development of groundglass and reticular opacities and small pleural effusions.  06/29/21: started on ivosidenib (IDH1 inhibitor) as inpatient with Dr. DCalla Kicksgiven reported [facility] [place]       Chondrosarcoma   04/18/2021 Initial Diagnosis    Chondrosarcoma     06/29/2021 -  Chemotherapy    OP AML Ivosidenib (Tibsovo)  Plan Provider: ADorthula Nettles MD  Treatment goal: Remission  Line of treatment:  [No plan line of treatment]         Past Medical History:   Diagnosis Date    Anxiety     Arthritis     DM (diabetes mellitus)     High cholesterol     HTN (hypertension)  Hydronephrosis     Nerve pain     PE (pulmonary thromboembolism)     Restless leg syndrome     Sciatica     Urethral stricture     Urinary retention      Past Surgical History:   Procedure Laterality Date    PR CYSTOURETHROSCOPY N/A 01/07/2021    Procedure: urethroscopy;  Surgeon: Lowella Dandy, MD;  Location: [facility] MAIN OR;  Service: Urology    PR CYSTOURETHROSCOPY N/A 03/14/2021    Procedure: CYSTOSCOPY with  internal uretheromy;  Surgeon: Lowella Dandy, MD;  Location: [facility] MAIN OR;  Service: Urology    TONSILLECTOMY AND ADENOIDECTOMY  1969       No family history on file.    No Known Allergies (drug, envir, food or latex)    Outpatient Medications Marked as Taking for the 07/30/21 encounter (Telemedicine Visit) with Dorthula Nettles, MD   Medication Sig Dispense Refill    DULoxetine (CYMBALTA) 20 mg DR capsule Take 40 mg by mouth daily      morphine 15 mg immediate release tablet Take 0.5 tablets (7.5 mg total) by mouth every 4 hours as needed for Pain  Max daily dose: 15 mg 60 tablet 0    morphine (MS CONTIN) 30 MG 12 hr tablet Take 1 tablet (30 mg total) by mouth 2 times daily  Max daily dose: 60 mg 60 tablet 0    ivosidenib (TIBSOVO) 250 mg tablet Take 500 mg by mouth daily         Social History     Socioeconomic History    Marital status: Married   Tobacco Use    Smoking status: Former     Years: 57.00     Types: Cigarettes     Start date: 1964    Smokeless tobacco: Never   Substance and Sexual Activity    Alcohol use: Not Currently    Drug use: Yes     Types: Marijuana         Physical Examination:  There were no vitals filed for this visit.  ECOG PERFORMANCE STATUS: (1) Restricted in physically strenuous activity, ambulatory and able to do work of light nature  Gen: No acute distress, nontoxic appearance, alert and  oriented x3  HEENT: normocephalic, no scleral icterus, extraocular movements intact   Pulm:unlabored, no coughing  Skin: no visible rashes  Neuro: Grossly nonfocal    Lab Results:       Lab results: 04/17/21  0552 04/16/21  0612 04/15/21  0518   WBC 5.0 4.4 4.5   Hemoglobin 11.0* 11.8* 10.1*   Hematocrit 33* 34* 30*   RBC 3.7* 3.9* 3.4*   Platelets 259 279 212   Neut # K/uL 3.4 2.5 3.2   Lymph # K/uL 0.9* 1.1* 0.7*   Mono # K/uL 0.5 0.4 0.4   Eos # K/uL 0.1 0.3 0.2   Baso # K/uL 0.0 0.0 0.0   Seg Neut % 68.4 57.0 69.4   Lymphocyte % 18.8 24.4 15.4   Monocyte % 9.2 10.1 9.3   Eosinophil % 2.8 7.1 4.8   Basophil % 0.4 0.7 0.7               Lab results: 04/17/21  0552 04/16/21  0612 04/15/21  0518 01/02/21  1233 11/19/20  0852   Sodium 137 137 137   < > 132*   132*   132*   Potassium 3.5 3.8 3.6   < > 4.1   4.1  4.1   Chloride 104 106 106   < > 96*   96*   96*   CO2 22 19* 22   < > _0 UN _1 < > _2 Creatinine 0.82 0.73 0.78   < > 1.1   1.1   1.1   GFR,Caucasian  --   --   --   --  67.83   67.83   67.83   GFR,Black  --   --   --   --  82.07   82.07   82.07   Glucose 93 102* 154*   < > 321*   321*   321*   Calcium 9.3 9.1 8.3*   < > 9.2   9.2   9.2   Total Protein 6.8 7.1 6.0*   < > 7.2   7.2   7.2   Albumin 3.4* 3.5 3.0*   < > 4.1   4.1   4.1   ALT _3 < > _4 AST _5 < > _6 Alk Phos 109 116 102   < > 143*   143*   143*   Bilirubin,Total 0.4 0.4 0.4   < > 0.4   0.4   0.4    < > = values in this interval not displayed.         No results for input(s): LD in the last 8760 hours.  No results for input(s): C1992, CEA3 in the last 8760 hours.      Lab results: 11/19/20  0852   PSA (eff. 01-2009) 1.06       No results for input(s): VID25 in the last 8760 hours.  No results for input(s): PO4 in the last 8760 hours.    Imaging:  CT chest with IV contrast    Result Date: 07/25/2021  Worsening lung parenchymal metastatic disease. END OF IMPRESSION UR Imaging  submits this DICOM format image data and final report to the [facility], an independent secure electronic health information exchange, on a reciprocally searchable basis (with patient authorization) for a minimum of 12 months after exam date.        Pathology:   [phone]   Additional Copy to:   [place]     FINAL DIAGNOSIS:   Outside material received from [place], [place] for   consultation.     [phone], 03/26/2021):   Bone and soft tissue, left hip, biopsy:   -Chondrosarcoma, grade 2 of 3.   MICROSCOPIC EXAMINATION: Performed     CLINICAL INFORMATION:   Outside material received for review prior to patient's next   appointment date/time: 07/22/2021     [id number]   Additional Copy to:   [facility]     FINAL DIAGNOSIS:   Outside material received from [facility] [place], [place] for   consultation.     [phone], 06/02/2021):   Lower extremity, left leg and hemipelvis, external hemipelvectomy and   leg amputation:   - Chondrosaroma.    - Procedure: Radical resection.       - Tumor site: Femur.    - Tumor size: 11.7 cm in greatest dimension (grossly).       - Tumor location and extent: Proximal femur and inferior  pelvis, extending into the surrounding fascia.    - Histologic type: Chondrosarcoma.    - Histologic grade: G3, poorly differentiated, high grade.       - Mitotic rate: up to 9 mitoses per 10 high-power fields.    - Necrosis: Present, 10%.    - Treatment effect: No known presurgical therapy.    - Lymphovascular invasion: Not identified.    - Margins: All margins negative for tumor.       - Regional lymph node status: No malignancy identified in five       lymph nodes (0/5) (per outside report, see comment).    - Pathologic stage: pT2pN0     Comment: Per outside report, all five lymph nodes were negative for   malignancy, however, only 31 of the 32 slides were provided for   evaluation, the missing slide,  A32-representative sections from one   possible lymph node, was not available for examination. The four lymph   nodes examined are negative for malignancy.       Assessment:   ERHARD SENSKE is a 65 y.o. male with metastatic chondrosarcoma, per path undifferentiated component, IDH1 mutant.  [facility] 582m po [facility]; will request ct chest next available to assess for stability on ivosidinib, previously relatively rapidly progressive.  He is tolerating ivosidinib well.  Will also request PD-L1 staining, consider ipi 1 / nivo 3 as next line of therapy if/when needed.  [id number] showed response in 1 of 5 patients with chrondrosarcoma (20%), [email or web address], and alliance trial did show overall higher response rates in soft tissue sarcomas to dual checkpoints ipi 1 /nivo 3 x4 than single agent PD-L1.      Stage: IV  Chemotherapy/treatment: ivosdidenib 5023mpo qd  Intent: control and containment    Recommendations:  Chrondrosarcoma:  -- stop ivosidinib 50059mo qd  -- would trial ipi 1mg43m + nivo 240mg71mq3 weeks x4   -- repeat  Ct chest/abd/pelvis prior to cycle 3, then prior to maintenance dose nivo     Urethral stricture:   -- underwent internal urethromy Dr. ZaheeLaddie Aquas/22, noted to be retaining over 1000ml 71m and as I understand it has had foley since  -- referral to urology in place    History DVT:  -- on now-amputated leg, nonocclusive, noted 03/24/21  -- [facility]  -- will follow up IVC filter at next imaging, discuss possible removal  -- currently not on anticoagulation, given removal of leg with DVT do not see that we need to start this now    Symptom control:  Pain:  -- on morphine 30mg E43mD + morphine 7.5 - 15mg  (48mtabs) prn q6 hrs prn, try to use sparing for chronic pain (ie, phantom pain)  -- PCP has written for gabapentin, duloxetine, also chronically on clonazepam  -- consider  palliative care referral in the future if not adequately controled     Follow up: for ipi + nivo cycle 1    The patient expressed an understanding and agreement with the plan. All questions were answered. The patient knows to call our office with any concerns or questions should they arise.     AdrienneDorthula Nettlesames P.Laurice Record [facility]   Department of Medicine

## 2021-07-30 NOTE — Telephone Encounter (Signed)
Returning call to patient.  Notified him that if he can get from the parking garage to [facility] main entrance, there will be volunteers that can assist him to his appointment.  Patient appreciative, will call if more assistance is needed.

## 2021-08-01 ENCOUNTER — Telehealth: Payer: Self-pay | Admitting: Primary Care

## 2021-08-01 ENCOUNTER — Encounter: Payer: Self-pay | Admitting: Gastroenterology

## 2021-08-01 NOTE — Telephone Encounter (Signed)
There isn't much to do about change in taste. He is on a couple different medications that could also be contributing that we do not want to adjust. Chemo and morphine are the big two.

## 2021-08-01 NOTE — Telephone Encounter (Signed)
Elmyra Ricks from [facility] left a message on the VM to clarify patients Foley Catheter care orders. She can be reached at [phone].

## 2021-08-01 NOTE — Telephone Encounter (Signed)
Patient called saying that his food is not tasting the same after his surgery that three to four weeks ago.   He said that he has been tested for covid multiple times and it comes back negative every time.   He doesn't have any of the symptoms other than his food tasting funny.    Please advise and give him a call back. Thanks.

## 2021-08-01 NOTE — Telephone Encounter (Signed)
Patient sent a mychart message stating he was actually just found out he was in contact with a covid positive person (person was positive Tuesday). Patient was instructed to retest for COVID.

## 2021-08-01 NOTE — Telephone Encounter (Signed)
Sent patient a Pharmacist, community message with below message from [place].

## 2021-08-01 NOTE — Telephone Encounter (Signed)
Tried calling patient but his voicemail was full and I could not leave a message.

## 2021-08-04 ENCOUNTER — Telehealth: Payer: Self-pay | Admitting: Hematology and Oncology

## 2021-08-04 NOTE — Telephone Encounter (Signed)
Patient is calling to see if there is a way to set up a place for him to stay the night for his appointment in Nov. Please call

## 2021-08-04 NOTE — Telephone Encounter (Signed)
Returning call to patient.  Medical oncology appointments are not set-up yet (waiting on insurance to approve ipilimumab + nivolumab).  Once scheduled, can look into lodging / transportation with social work.  Patient appreciative of call, no further questions at this.

## 2021-08-04 NOTE — Telephone Encounter (Signed)
Elmyra Ricks states she tried to see patient last week to change his catheter but patient refuse as he "had a meeting with a business partner". Elmyra Ricks said she will try again this week. Elmyra Ricks will also let patient know he should make a follow up appointment with [place] soon.

## 2021-08-05 ENCOUNTER — Telehealth: Payer: Self-pay | Admitting: Primary Care

## 2021-08-05 ENCOUNTER — Encounter: Payer: Self-pay | Admitting: Gastroenterology

## 2021-08-05 NOTE — Plan of Care (Signed)
DISCONTINUE OFF PATHWAY REGIMEN - Sarcoma            Ivosidenib (Tibsovo)     **Always confirm dose/schedule in your pharmacy ordering system**    REASON: Disease Progression  PRIOR TREATMENT:   TREATMENT RESPONSE: Progressive Disease (PD)    START OFF PATHWAY REGIMEN - Sarcoma            Nivolumab (Opdivo)       Ipilimumab (Yervoy)       Nivolumab (Opdivo)     **Always confirm dose/schedule in your pharmacy ordering system**    Patient Characteristics:  Other Sarcoma Subtypes, Other Histology  Histology/Anatomic Site: Other Histology  Histology being treated: chrondrosarcoma, IDH1 mutated  Intent of Therapy:  Non-Curative / Palliative Intent, Discussed with Patient

## 2021-08-05 NOTE — Telephone Encounter (Signed)
Alexis from [place] called and states patient has been having Right shoulder pain for the last 3 weeks. She states his pain is 5/6 and that its been hurting since a fall that was 3 weeks ago that we were notified about.    Please advise.

## 2021-08-06 ENCOUNTER — Encounter: Payer: Self-pay | Admitting: Gastroenterology

## 2021-08-06 ENCOUNTER — Ambulatory Visit: Payer: Medicaid Other | Admitting: Primary Care

## 2021-08-06 NOTE — Telephone Encounter (Signed)
Alexis from [place] is aware and going to notify the office. Spoke to patient he is going to come in today 08/06/21 for an appointment.

## 2021-08-06 NOTE — Progress Notes (Signed)
[facility] SOCIAL WORK:   [facility] Social Work Therapist, nutritional:     Patient referred based on distress screen?: No      Social Work contact made?: Yes      Method of contact:  Telephone    Other method of contact:  Patient [phone]    Social Work Assessment:  Barriers to Care    Barriers to care:  Lodging    Social Work post assessment plan:  Referral  Referral:     Navigation:  Initiated request for [facility] 11/6-11/7 through [facility]    Time spent on this encounter (in minutes):  30    Patient reached out to social work for assistance with local lodging prior to appointment on 11/7.  I explained the referral process and services available at [facility] but he prefers to utilize [employer] ([place]).  I have initiated a reservation request for him commencing 11/6 for one night.  He understands that they will contact him directly to confirm.  He has been provided with their contact information.    I reminded him about the gas cards from the [employer] available for him to pick up next week.    He confirmed receipt of the [place] application.    He prefers telemedicine visits whenever possible which I conveyed to his team.  He understands many will require [place] appointments.    Confirmed he is scheduled to see the psychoscoial behavioral health therapist later this month.      He expressed appreciation and denied additional psychosocial concerns at this time.  Will follow.    Hazle Nordmann, LMSW, OSW-C  Senior Social Worker  [employer]  [phone]

## 2021-08-06 NOTE — Patient Instructions (Addendum)
Your Care Team      Phone:  [phone]  Fax:  [phone] or [phone]    Physician:  Adrienne Victor, MD  Nurse Practitioner: Scott Cody, NP  Nurse:   Savannah Contestabile, RN  Social Worker:  Wanda Garren  Pharmacist:  Michelle Phillips    You can reach us at [phone].  Please be prepared to let the secretary know why you are calling.  This will help let your nurse know how urgent the call is.  We will return your call as soon as possible, on clinic days it may take us longer.  After hours, weekends, and holidays the phone is answered by the answering service.  They will take a message and forward it to the doctor on-call who can access your chart and assist you.    Please call this same number if you need to cancel or re-schedule your appointment.    For [place] patient billing questions, please contact [phone] and your call will be directed to the appropriate person.    Please allow 5 days to re-fill any prescriptions and 5-10 days to complete disability or FMLA paperwork.    November 2022      Sunday Monday Tuesday Wednesday Thursday Friday Saturday             1     2  ACUTE   2:15 PM   Monroe, Cortni Claire, PA   UR Medicine - [facility] 3     4     5       6     7  FOLLOW UP VISIT  10:30 AM   Cody, Scott Joseph, NP   [facility]    C1 ipi / nivo   1:30 PM   [facility]    PROSTHETICS EVALUATION   3:15 PM   Bebber, Victoria   Orthotics, Prosthetics, and Pedorthics 8     9     10     11     12       13     14     15     16     17     18     19       20     21     22     23     24  HOLIDAY   25  HOLIDAY   26       27  Please have labs drawn either 1-2 days before appt at any [place] draw station or 1 hour before in [facility] lobby. 28 C2    Victor / Cody  @_____    Ipilimumab +    Nivolumab  @_____ 29     30  VIDEO VISIT  10:00 AM   Read, Whitney Clark, LCSW-R   [facility]                              December 2022       Sunday Monday Tuesday Wednesday Thursday Friday Saturday                       1     2     3       4     5     6     7       8     9     10       11     12  Scans prior to 09/22/21 visit!!    13     14     15     16     17       18  Please have labs drawn either 1-2 days before appt at any [place] draw station or 1 hour before in [facility] lobby. 19 C3  Victor / Cody  @_____    Ipilimumab +    Nivolumab  @_____   20     21     22     23     24       25     26  HOLIDAY   27     28     29     30     04 November 2021      Sunday Monday Tuesday Wednesday Thursday Friday Saturday   1     2  HOLIDAY   3     4     5     6     7       8  Please have labs drawn either 1-2 days before appt at any [place] draw station or 1 hour before in [facility] lobby. 9 C4  Victor / Cody  @_____    Ipilimumab +    Nivolumab  @_____   10     11     12     13     14       15     16     17     18     19     20     21       22     23     24     25     26     27     28       29 30   31

## 2021-08-08 ENCOUNTER — Ambulatory Visit: Payer: Medicaid Other

## 2021-08-08 ENCOUNTER — Other Ambulatory Visit: Payer: Self-pay | Admitting: Primary Care

## 2021-08-08 ENCOUNTER — Encounter: Payer: Self-pay | Admitting: Gastroenterology

## 2021-08-08 ENCOUNTER — Telehealth: Payer: Self-pay | Admitting: Primary Care

## 2021-08-08 MED ORDER — DULOXETINE HCL 20 MG PO CPEP *I*
DELAYED_RELEASE_CAPSULE | ORAL | 0 refills | Status: AC
Start: 2021-08-08 — End: ?

## 2021-08-08 MED ORDER — TAMSULOSIN HCL 0.4 MG PO CAPS *I*
0.4000 mg | ORAL_CAPSULE | Freq: Every evening | ORAL | 0 refills | Status: AC
Start: 2021-08-08 — End: 2021-09-07

## 2021-08-08 MED ORDER — ATORVASTATIN CALCIUM 10 MG PO TABS *I*
10.0000 mg | ORAL_TABLET | Freq: Every day | ORAL | 0 refills | Status: AC
Start: 2021-08-08 — End: ?

## 2021-08-08 MED ORDER — ERGOCALCIFEROL 50000 UNIT PO CAPS *I*
ORAL_CAPSULE | ORAL | 0 refills | Status: AC
Start: 2021-08-08 — End: ?

## 2021-08-08 NOTE — Telephone Encounter (Signed)
Olivia Mackie from [place] called stating patient keeps calling there requesting refills of his scripts. Patient no showed his appointment with you here and has not had a follow up since coming home from the [facility]. His visiting nursing has called several times for several issues, patient should be seen for a follow up. I told Olivia Mackie I would ask I you would refill his scripts (he is requesting duloxetine, atorvastatin and vitamin D2. I have the paper reqs from [facility] for reference). What would you like to do? Send to Deanna to book a follow up appointment and then send scripts? Please advise.

## 2021-08-08 NOTE — Telephone Encounter (Signed)
We can send a refill for his prescriptions and he needs to be booked for a follow up appointment

## 2021-08-08 NOTE — Telephone Encounter (Signed)
Prescriptions sent. Please have patient book follow up appointment. Can do phone if he would like.

## 2021-08-08 NOTE — Telephone Encounter (Signed)
Pending for a month supply. Please send to Deanna to call patient for an appt.

## 2021-08-10 ENCOUNTER — Telehealth: Payer: Self-pay

## 2021-08-10 NOTE — Telephone Encounter (Signed)
Attempted again to reach pt  No answer, unable to leave VM

## 2021-08-10 NOTE — Telephone Encounter (Signed)
Called Craig Gilbert back after receiving a call through the weekend triage line-  Attempted to return call - no answer  Unable to leave voicemail, mailbox is full

## 2021-08-10 NOTE — Telephone Encounter (Signed)
Called Craig Gilbert back after receiving a call through the weekend triage line-    He is calling today to inquire if he should come to his scheduled visit tomorrow  Pt states he has noticed floaties in his foley bag  Foley changed last 3 days ago by home care nurse  Unable to check his temperature at home to determine if he has has a fever  Pt also states he has a wound in his pannus that is getting bigger, was three small spots but now is one big spot  He also states he has been coughing up phlegm  He states it is thick and hard to cough up  He is taking Mucinex which is helping break it up and get it out  Pt endorses feeling constipated   Takes stool softener from [place], but does not take regularly since it causes him incontinence   Pt also endorses taste buds being off, things taste very salty or like chemicals      Writer advised all the above would not affect him being seen in clinic tomorrow  Writer advised pt should come to his scheduled appt tomorrow and get these concerns addressed by his oncology team  Review appointments times and locations    Pt verbalized understanding and was appreciative of the call back.

## 2021-08-11 ENCOUNTER — Encounter: Payer: Self-pay | Admitting: Hematology and Oncology

## 2021-08-11 ENCOUNTER — Ambulatory Visit: Payer: Medicaid Other

## 2021-08-11 ENCOUNTER — Other Ambulatory Visit: Payer: Self-pay

## 2021-08-11 ENCOUNTER — Other Ambulatory Visit
Admission: RE | Admit: 2021-08-11 | Discharge: 2021-08-11 | Disposition: A | Discharge status: Home / Self Care | Payer: Medicaid Other | Source: Ambulatory Visit

## 2021-08-11 ENCOUNTER — Ambulatory Visit: Payer: Medicaid Other | Attending: Hematology and Oncology | Admitting: Family

## 2021-08-11 ENCOUNTER — Encounter: Payer: Self-pay | Admitting: Urology

## 2021-08-11 ENCOUNTER — Telehealth: Payer: Self-pay | Admitting: Hematology and Oncology

## 2021-08-11 VITALS — BP 142/72 | HR 88 | Temp 97.2°F | Resp 18

## 2021-08-11 DIAGNOSIS — C419 Malignant neoplasm of bone and articular cartilage, unspecified: Secondary | ICD-10-CM

## 2021-08-11 DIAGNOSIS — T148XXA Other injury of unspecified body region, initial encounter: Secondary | ICD-10-CM

## 2021-08-11 DIAGNOSIS — M549 Dorsalgia, unspecified: Secondary | ICD-10-CM

## 2021-08-11 DIAGNOSIS — M25559 Pain in unspecified hip: Secondary | ICD-10-CM

## 2021-08-11 DIAGNOSIS — N1330 Unspecified hydronephrosis: Secondary | ICD-10-CM | POA: Insufficient documentation

## 2021-08-11 DIAGNOSIS — Z5111 Encounter for antineoplastic chemotherapy: Secondary | ICD-10-CM | POA: Insufficient documentation

## 2021-08-11 LAB — COMPREHENSIVE METABOLIC PANEL
ALT: 9 U/L (ref 0–50)
AST: 8 U/L (ref 0–50)
Albumin: 4.2 g/dL (ref 3.5–5.2)
Alk Phos: 141 U/L — ABNORMAL HIGH (ref 40–130)
Anion Gap: 12 (ref 7–16)
Bilirubin,Total: 0.3 mg/dL (ref 0.0–1.2)
CO2: 26 mmol/L (ref 20–28)
Calcium: 9.5 mg/dL (ref 8.6–10.2)
Chloride: 97 mmol/L (ref 96–108)
Creatinine: 0.79 mg/dL (ref 0.67–1.17)
Glucose: 219 mg/dL — ABNORMAL HIGH (ref 60–99)
Lab: 8 mg/dL (ref 6–20)
Potassium: 4.2 mmol/L (ref 3.3–5.1)
Sodium: 135 mmol/L (ref 133–145)
Total Protein: 7 g/dL (ref 6.3–7.7)
eGFR BY CREAT: 100 *

## 2021-08-11 LAB — CBC AND DIFFERENTIAL
Baso # K/uL: 0 10*3/uL (ref 0.0–0.1)
Basophil %: 0.2 %
Eos # K/uL: 0.1 10*3/uL (ref 0.0–0.5)
Eosinophil %: 2.1 %
Hematocrit: 36 % — ABNORMAL LOW (ref 40–51)
Hemoglobin: 11.7 g/dL — ABNORMAL LOW (ref 13.7–17.5)
IMM Granulocytes #: 0 10*3/uL (ref 0.0–0.0)
IMM Granulocytes: 0.4 %
Lymph # K/uL: 1.3 10*3/uL (ref 1.3–3.6)
Lymphocyte %: 23.6 %
MCH: 27 pg (ref 26–32)
MCHC: 32 g/dL (ref 32–37)
MCV: 84 fL (ref 79–92)
Mono # K/uL: 0.4 10*3/uL (ref 0.3–0.8)
Monocyte %: 7.3 %
Neut # K/uL: 3.7 10*3/uL (ref 1.8–5.4)
Nucl RBC # K/uL: 0 10*3/uL (ref 0.0–0.0)
Nucl RBC %: 0 /100 WBC (ref 0.0–0.2)
Platelets: 206 10*3/uL (ref 150–330)
RBC: 4.3 MIL/uL — ABNORMAL LOW (ref 4.6–6.1)
RDW: 15.5 % — ABNORMAL HIGH (ref 11.6–14.4)
Seg Neut %: 66.4 %
WBC: 5.6 10*3/uL (ref 4.2–9.1)

## 2021-08-11 LAB — MULTIPLE ORDERING DOCS

## 2021-08-11 LAB — T4, FREE: Free T4: 1.4 ng/dL (ref 0.9–1.7)

## 2021-08-11 LAB — CK: CK: 47 U/L (ref 39–308)

## 2021-08-11 LAB — LACTATE DEHYDROGENASE: LD: 197 U/L (ref 118–225)

## 2021-08-11 LAB — PHOSPHORUS: Phosphorus: 3.8 mg/dL (ref 2.7–4.5)

## 2021-08-11 LAB — TSH: TSH: 1.42 u[IU]/mL (ref 0.27–4.20)

## 2021-08-11 LAB — URIC ACID: Urate: 4.9 mg/dL (ref 3.9–9.0)

## 2021-08-11 LAB — NEUTROPHIL #-INSTRUMENT: Neutrophil #-Instrument: 3.7 10*3/uL

## 2021-08-11 LAB — CORTISOL: Cortisol,Random: 6.2 ug/dL

## 2021-08-11 MED ORDER — SEQUENCING OF DRUGS FOR ADMINISTRATION *I*
1.0000 | Status: DC | PRN
Start: 2021-08-11 — End: 2021-08-12
  Filled 2021-08-11: qty 1

## 2021-08-11 MED ORDER — TRAZODONE HCL 50 MG PO TABS *I*
50.0000 mg | ORAL_TABLET | Freq: Every evening | ORAL | 2 refills | Status: AC
Start: 2021-08-11 — End: ?

## 2021-08-11 MED ORDER — SODIUM CHLORIDE 0.9 % IV SOLN WRAPPED *I*
240.0000 mg | Freq: Once | INTRAVENOUS | Status: AC
Start: 2021-08-11 — End: 2021-08-11
  Administered 2021-08-11: 240 mg via INTRAVENOUS
  Filled 2021-08-11: qty 240

## 2021-08-11 MED ORDER — MORPHINE SULFATE ER 15 MG PO TBCR *I*
45.0000 mg | ORAL_TABLET | Freq: Two times a day (BID) | ORAL | 0 refills | Status: AC
Start: 2021-08-11 — End: 2021-09-10

## 2021-08-11 MED ORDER — METRONIDAZOLE 500 MG PO TABS *I*: ORAL_TABLET | ORAL | 0 refills | Status: AC

## 2021-08-11 MED ORDER — SODIUM CHLORIDE 0.9 % IV SOLN WRAPPED *I*
1.0000 mg/kg | Freq: Once | INTRAVENOUS | Status: AC
Start: 2021-08-11 — End: 2021-08-11
  Administered 2021-08-11: 100.2 mg via INTRAVENOUS
  Filled 2021-08-11: qty 20.04

## 2021-08-11 NOTE — Progress Notes (Signed)
[facility] SOCIAL WORK:   [facility] Social Work Therapist, nutritional:     Patient referred based on distress screen?: No      Social Work contact made?: Yes      Method of contact:  Telephone    Other method of contact:  [phone] patient    Social Work Assessment:  Barriers to Care    Barriers to care:  Transportation    Social Work post assessment plan:  Referral  Referral:     Navigation:  [place] appointment/ patient will reschedule    Transportation:  [place] [phone]  08/11/21    Time spent on this encounter (in minutes):  90    Asked to arrange transportation for patient's appointments today due to unexpected car trouble.  I have arranged [employer] [phone]) as follows:    08/11/21 pick up as soon as possible from [address] [place], [place] for 1:30 PM appointment at [facility]    I have also arranged [employer] [phone]) as follows:    08/11/21  Will call return home to [address], [place]    Patient will reschedule his orthotics/prosthetics appointment because he would not have been finished with chemotherapy in time to keep scheduled appointment and they were unable to see him later today.     Patient aware and agreeable to above plans.  Social Work will remain available to this patient and intervene as needs arise throughout the course of his care.     Hazle Nordmann, LMSW, OSW-C  Senior Social Worker  [employer]  [phone]

## 2021-08-11 NOTE — Patient Instructions (Signed)
Your Care Team      Phone:  [phone]  Fax:  [phone] or [phone]    Physician:  Dorthula Nettles, MD  Nurse Practitioner: Scotty Court, NP  Nurse:   Mallie Darting, RN  Social Worker:  Marquita Palms  Pharmacist:  Mckinley Jewel    You can reach [place] at [phone].  Please be prepared to let the secretary know why you are calling.  This will help let your nurse know how urgent the call is.  We will return your call as soon as possible, on clinic days it may take [place] longer.  After hours, weekends, and holidays the phone is answered by the answering service.  They will take a message and forward it to the doctor on-call who can access your chart and assist you.    Please call this same number if you need to cancel or re-schedule your appointment.    For [facility] patient billing questions, please contact [phone] and your call will be directed to the appropriate person.    Please allow 5 days to re-fill any prescriptions and 5-10 days to complete disability or FMLA paperwork.    November 2022      Sunday Monday Tuesday Wednesday Thursday Friday Saturday             1     2  ACUTE   2:15 PM   Monroe, Cortni Claire, PA   UR Medicine - [facility] 3     4     5       6     7  FOLLOW UP VISIT  10:30 AM   Cody, Scott Joseph, NP   [facility]    C1 ipi / nivo   1:30 PM   [facility]    PROSTHETICS EVALUATION   3:15 PM   Bebber, Victoria   Orthotics, Prosthetics, and Pedorthics 8     9     10     11     12       13     14     15     16     17     18     19       20     21     22     23     24  HOLIDAY   25  HOLIDAY   26       27  Please have labs drawn either 1-2 days before appt at any  draw station or 1 hour before in [facility] lobby. 28 C2    Victor / Cody  @_____    Ipilimumab +    Nivolumab  @_____ 29     30   VIDEO VISIT  10:00 AM   Read, Luellen Pucker, LCSW-R   [place]                              December 2022       Sunday Monday Tuesday Wednesday Thursday Friday Saturday                        1     2     3       4     5     6      7  8     9     10       11     12   Scans prior to 09/22/21 visit!!    13     14     15     16     17       18   Please have labs drawn either 1-2 days before appt at any [facility] draw station or 1 hour before in [facility] lobby. [place] / Cody  @_____     Ipilimumab +    Nivolumab  @_____    20     21     22     23     24       25     26   HOLIDAY   27     28     29     30     04 November 2021      Sunday Monday Tuesday Wednesday Thursday Friday Saturday   1     2  HOLIDAY   3     4     5     6     7       8  Please have labs drawn either 1-2 days before appt at any [place] draw station or 1 hour before in [facility] lobby. 9 C4  Victor / Cody  @_____    Ipilimumab +    Nivolumab  @_____   10     11     12     13     14       15     16     17     18     19     20     21       22     23     24     25     26     27     28       29  30   31

## 2021-08-11 NOTE — Telephone Encounter (Signed)
error 

## 2021-08-11 NOTE — Progress Notes (Signed)
Pt was seen in infusion center for: First time Nivolumab and Ipilimumab     Patient identification verified by 2 RNs: [x] YES [] NO  Drug/dose verified by 2 RNs: [x] YES [] NO  Current consent present in chart: [x] YES [] NO [] OTHER   Consent was obtained at [place] and will be scanned  Clinic handoff reviewed: [x] YES [] NO [] N/A    Verbal handoff from Scotty Court, NP  Labs reviewed and meet treatment parameters: [x] YES [] NO [] OTHER   Pre-medications given: [] YES [] NO [x] N/A  IV device: [x] PIV [] IVAD [] PICC [] TCVC  Blood return present before and after infusion: [x] YES [] NO     Pt oriented to infusion center, pods, and call light. First dose education on Nivolumab and Ipilimumab and "Caring Yourself After Immunotherapy" provided. Patient tolerated Nivolumab and Ipilimumab well without incident.     Patient and/or family educated regarding:   [x] YES [] NO drugs received    [x] YES [] NO toxities   [x] YES [] NO toxicity management  [x] YES [] NO follow up care     Upon completion of treatment, PIV flushed and removed, per protocol. Patient discharged home in stable condition, aware of future appointments. Encouraged to call with any questions or concerns.     Patient complied with masking policy throughout duration of appointment. Writer maintained use of mask and faceshield/eyewear throughout duration of appointment. Writer was within 6 feet of patient for > 15 minutes due to the nature of the visit.

## 2021-08-11 NOTE — Progress Notes (Signed)
New Patient H&P    Patient ID: MELL GUIA is a 65 y.o. male     Diagnosis:  Metastatic chondrosarcoma, poorly differentiated     Current treatment: ivosidenib 596m po qd started 06/29/21 (IDH1 inhibitor)  Start ipi + nivo 08/11/21    Prior treatment:   -- [facility]    Pertinent comorbidities:  -- urethral stricture and indwelling foley catheter     Referred by: MArva Chafe *    Interval hx:  + constipation  Still having debris in his foley. Notes it is uncomfortable but he is working with his PCP  + insomnia, partly due to pain partly due to inability to sleep consistently  Worsening R hip and lower back pain. He notes the right hip feels the exact same way the left hip did prior to his diagnosis  Foley changed last 3 days ago by home care nurse  Pt also states he has a wound in his pannus that is getting bigger, was three small spots but now is one big spot  He also states he has been coughing up phlegm  He states it is thick and hard to cough up  He is taking Mucinex which is helping break it up and get it out  Takes colace daily  Pt also endorses taste buds being off, things taste very salty or like chemicals        History of Present Illness:  HPI  RCLEOFAS HUDGINSis a 65y.o. male who comes to clinic for multidisciplinary assessment and ongoing care of metastatic chondrosarcoma.  Interval history as outlined below; briefly he had a large (11.4) cm [facility].  He had had prior CT scans 3 months prior without evidence of malignancy.  At time of surgery he had small but suspicious lung nodules (known prior to hemipelvectomy) with notable progression on repeat ct chest 6 weeks then slight further progression 1 month later.  [facility]  NGS profiling done and with IDH1 mutation, and he was started on ivosidenib  there 06/29/21.  He had a prolonged stay and essentially inpatient rehab due to insurance difficulties, [facility]    He is accompanied by his nephew (sister's son) and nephew's wife.  Ortho onc also saw today, appreciate their coordination, incision site well healed.  Mylan L Aliano's biggest concern is ongoing foley; he finds it very uncomfortable.  I will place urology referral and we did give him some supplies to try to help hold the tubing in a more comfortable position.  It looks like he did previously see urology Dr. ZLaddie Aquasand was diagnosed with a urinary stricture, and had urethromy 03/14/21, but continued to have urinary retention after requiring foley re-replacement 03/22/21.  It is not clear to me from notes if further attempts/evaluation was done subsequently.      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       On ROS, he did fall yesterday, got tangled up in the wheels for his chair.  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 Currently getting around with a walker.  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    He denies overt side effects from ivosidenib.  We did check EKG today with normal qtc.  I did place labs, will ask him to get these after our visits.    He has phantom limb pain and as I understand it chronic back pain.  He was on oxycodone in the past for the back; has been on morphine 37m ER currently for pain.  I will continue this; I will add on a prn IR morphine, stressed that he should truly use this "if needed" not max it out.  For more chronic type pain may note needing escalating doses of opiates for relief, which can lead to greater side effects and problems without major net benefit; if pain continues to be a major factor (particularly if not  acute oncologic pain) consider palliative care referral in the future.  For now we will try to manage if needs are pretty stable.  He is on clonazepam chronically for anxiety through PCP as well; we will differ to PCP for now, stressed that he should not take multiple sedating medications at the same time particularly if they are new/he is not used to them.  He is also appropriately on gabapentin and duloxetine.    I am concerned about reports of stopping insulin though would like to check current lab values first as well as A1c, and then should confer further with PCP.      He used to smoke, quit 3 months ago  / with diagnosis. .     Family history:  Notes extensive family history of cancer  Grandfather, father, grandmother - all with cancer, uncles  Has a brother and a sister, brother had leukemia    Reports [place]     We did touch on prognosis; given prior fairly rapid growth of disease (chrondrosarcoma can range from slow growing to fast; possible undifferentiated component on path concerning for more aggressive disease) I think updating a CT chest to evaluate efficacy of ivosidenib fairly soon is in order and RGURLEY CLIMERvery much would like to do this as well.  If stable consider repeat q3 months.  Shrinkage on ivosidenib is unlikely based on literature to date though of course would be welcome.  If progressing, consider ipi 1 / nivo 3, and then pazopanib.  Usually chemotherapy not used for chondrosarcoma though will have to evaluate undifferentiated portion more; may be considered for dedifferentiated chondrosarcoma.      They were interested in reviewing MOL[facility] and I gave a form for them to consider; perhaps the most important one being whether he would want to be DNR/DNI now.  I don't have a strong recommendation on that currently as we are trying for treatment and he is otherwise pretty motivated with good performance status considering what he has been through right now.  I did  discuss goals of care; currently we are on "palliative" or "control and containment" intent with anticancer treatment; if we run out of good options, we might recommend best supportive care (if there are other problems to still treat) or else comfort care, which is often done with hospice.  It may be worth considering what he might want if/when hospice becomes appropriate; while I do hope we can [facility] this at [facility] (years?  Again, will know more with updated scans) I think it's best to have these conversations well in advance, rather than waiting for it to be  an urgent need, especially since they can sometimes take some planning.    They mentioned possibly wanting [facility] in [place] if/when possibly needing to transition.     Regarding data for ivosidenib:  Phase I Study of the Mutant IDH1 Inhibitor Ivosidenib: Safety and Clinical Activity in Patients With Advanced Chondrosarcoma  Lendon Ka. Tap, Marcha Solders, Jeronimo Greaves. [place], Blanca Friend, Filip [place], Aurora Mir, Murali [place], Lenard Lance, [place], [place], [place], Tomas de Castro, Camelia Lennox, [place], Sam [place], Shuchi S. Earley Brooke, and [place] [facility] 2020 38:15, [phone]   https://ascopubs.org/doi/full/10.1200/JCO.[email or web address]#:~:text=In%20patients%20with%20chondrosarcoma%2C%20ivosidenib%20showed%73mnimal%20toxicity%2C,chondrosarcoma.%20Chondrosarcomas%20are%20rare%20primary%20malignancies%20of%20bone.    Safety:   The most common treatment-emergent AEs in the chondrosarcoma cohort (Table 2) were mostly grade 1 or 2. Of the grade ? 3 AEs in 12 patients (57.1%; Data Supplement), only one was judged to be treatment related by the investigator (hypophosphatemia, n = 1). One patient had an AE leading to permanent drug discontinuation (hydronephrosis at 500 mg), which was not considered treatment related, and two had AEs leading to drug hold (anemia at 400 mg and confusion at 500 mg, respectively).  There were no AEs leading to dose reductions. Grade 1 or 2 ECG QT prolongation occurred in five patients (23.8%); there were no grade ? 3 events, and no dose modifications were required for QT prolongation. There were two deaths during treatment resulting from serious AEs: acute respiratory failure at 400 mg and respiratory failure at 500 mg, respectively, both considered unrelated to study treatment by the investigator.    Efficacy:  All 21 patients with chondrosarcoma were included for efficacy evaluation; 11 (52%) experienced SD as the best overall response by RECIST, six (29%) had PD, one (5%) had a response of unknown, and three (14%) were not assessed. There were no CRs or PRs. As of the data cutoff, median PFS for all patients (n = 21) was 5.6 months (95% CI, 1.9 to 7.4 months), with 3- and 628-monthFS rates of 62.1% (95% CI, 36.4% to 79.9%) and 39.5% (95% CI, 17.9% to 60.6%), respectively (Table 3; Data Supplement). Although the number of patients with dedifferentiated histology was small (n = 6), their outcomes were poor, with only 30% of patients progression free at 3 months and none by 6 months.     Oncology History Overview Note   11/19/20: [facility] no [facility] lesions.    [facility] Za[facility] Aqu[facility] stricture  03/14/21: cystoscopy with internal urethromy Dr. ZaLaddie Aquasnd foley placement  03/19/21: [facility], [facility], L hip pain, Imaging concerning for L hip abscess and osteomyelitis.  CT a/p with new fluid collection adjacent to proximal L femur/lesser chrocanter, though tot be possible bursiitis or abscess.  New compared with prior scans from 12/16/20.  03/22/21: CT L femur with contrast at [facility] "again seen is multiloculated fluid collection surrounding the left proximal femur, measuring 11.4 cm x 10.1 cm x 13.4 cm. This may represent fluid within bursae. Other etiologies, including infection,  are not excluded. There is no significant adjacent fat stranding. There is no disruption of the underlying cortex. Noted to have urinary retention 100022mnd foley catheter reinserted as well.    03/24/21: MRI L femur/thigh w/wo contrast "Pattern consistent with femoral osteomyelitis and possible periosteal abscesses, consider aspiration for definitive diagnosis" Noted to be limited by motion artifact.  Bilateral lower extremity ultrasound same day also with partial,  nonocclusive DVT in the left common femoral vein.  03/25/21 - 04/02/21: [facility] for [facility]&D of presumed abscess (non oncologic procedure).  Subsequent pathology, reviewed at [facility] (also [place] [facility]) with chondrosarcoma grade 2 of 3.  Patient was discharged to rehab and developed femoral neck fracture through chrondrosarcoma.    04/14/21: CT L hip and femur with contrast with Interval increase in size of the complex periarticular fluid collections adjacent to the proximal femur.  Interval development of a subacute pathologic displaced fracture at the basicervical femoral neck with posterior displacement and varus angulation at the fracture site.  04/18/21: CT chest with scattered indeterminate pulmonary nodules bilaterally up to 34m (RLL)   04/20/21 - 07/04/21[facility]   04/22/21: infrarenal IVC filter placement   04/24/21:  Patient elected to proceed with left External Hemipelvectomy (CPT 27290), Left Posterior Gluteus Maximus Neurovascular Pedicle Flap use in order to Reconstruct Left Hemipelvis/Trunk Defect   05/22/21: Debridement and wound vac change (Left External Hemipelvectomy Wound Debridement with Small Pocket of Seroma Decompression, Placement of Fibrillar Impregnated ABx Sponges, Placement of Conventional + Incisional Wound VAC, >50cm2, Durable Medical Device)  06/02/21: Debridement and wound vac change [1) Left External Hemipelvectomy Wound Debridement with Small Pocket of Seroma  Decompression, Primary Closure of 15cm of Skin and adipose tissue, along with deep fascia, Placement of Incisional Wound VAC, >50cm2, Durable Medical Device (CPT [id number], Foley Catheter was exchanged by the Urology team for a Urethral stricture]  06/05/21: CT chest with interval development of multiple nodules in both lungs.  The previously noted right lower lobe nodule has increased in size   06/16/21: Debridement and wound vac change [1) Left External Hemipelvectomy Wound Debridement with Small Pocket of Seroma Decompression , Placement of Conventional Wound VAC, >50cm2, Durable Medical Device ]  06/19/21: Debridement and wound complex closure and wound vac placement [Excisional debridement of left hip wound 15x9 cm2 in preparation for closure, Complex closure of left hip wound 15 cm of length, Wound vac placement (non-disposable) < 50cm2 ]  06/28/21: CT chest the nodules appear stable to slightly increased in size since the exam of 09/01. For example, a solid nodule in the left lower lobe (4:148) measures 29 x 24 mm on today's exam versus 20 x 16 mm on the prior study. A 16 x 13 mm LUL indeterminate nodule (4:50) measured 13 x 10 mm on the prior exam. An 11 x 11 mm RLL nodule (4:100) measured 9 x 7 mm on the prior exam.  Also interval development of groundglass and reticular opacities and small pleural effusions.  06/29/21: started on ivosidenib (IDH1 inhibitor) as inpatient with Dr. DCalla Kicksgiven [facility]    07/25/21: short interval CT chest with progression compared with 06/28/21   08/11/21: Start ipi/nivo     Chondrosarcoma   04/18/2021 Initial Diagnosis    Chondrosarcoma     06/29/2021 - 06/29/2021 Chemotherapy    OP AML Ivosidenib (Tibsovo)  Plan Provider: ADorthula Nettles MD  Treatment goal: Remission  Line of treatment: [No plan line of treatment]     08/11/2021 -  Chemotherapy    OP Sarcoma Ipilimumab Nivolumab  Plan Provider: ADorthula Nettles MD  Treatment goal:  Remission  Line of treatment: [No plan line of treatment]         Past Medical History:   Diagnosis Date    Anxiety     Arthritis     DM (diabetes mellitus)  High cholesterol     HTN (hypertension)     Hydronephrosis     Nerve pain     PE (pulmonary thromboembolism)     Restless leg syndrome     Sciatica     Urethral stricture     Urinary retention      Past Surgical History:   Procedure Laterality Date    PR CYSTOURETHROSCOPY N/A 01/07/2021    Procedure: urethroscopy;  Surgeon: Lowella Dandy, MD;  Location: [facility] MAIN OR;  Service: Urology    PR CYSTOURETHROSCOPY N/A 03/14/2021    Procedure: CYSTOSCOPY with  internal uretheromy;  Surgeon: Lowella Dandy, MD;  [facility];  Service: Urology    TONSILLECTOMY AND ADENOIDECTOMY  1969       No family history on file.    No Known Allergies (drug, envir, food or latex)    No outpatient medications have been marked as taking for the 08/11/21 encounter (Office Visit) with Renato Shin, NP.       Social History     Socioeconomic History    Marital status: Married   Tobacco Use    Smoking status: Former     Years: 57.00     Types: Cigarettes     Start date: 1964    Smokeless tobacco: Never   Substance and Sexual Activity    Alcohol use: Not Currently    Drug use: Yes     Types: Marijuana         Physical Examination:  There were no vitals filed for this visit.  ECOG PERFORMANCE STATUS: (1) Restricted in physically strenuous activity, ambulatory and able to do work of light nature  Gen: No acute distress, nontoxic appearance, alert and oriented x3  HEENT: normocephalic, no scleral icterus, extraocular movements intact, moist mucus membranes.    CV: regular rate and rhythm, no murmurs/gallops/rubs  Pulm: clear to ascultation bilaterally, unlabored.  No wheezes or rhonchi.  GI: Abdomen soft, nontender, nondistended, bowel sounds positive.     Extremities: no R lower extremity edema, +L extremity surgically absent.  L pelvic scar all closed,  except for one open area in left groin/panus. Photo in media section.  +Foley catheter in place with bag.  Clear yellow urine  Onc: no lymphadenoapthy, no bruising  Skin: no rashes  Neuro: Grossly nonfocal    Lab Results:       Lab results: 08/11/21  1426 04/17/21  0552 04/16/21  0612   WBC 5.6 5.0 4.4   Hemoglobin 11.7* 11.0* 11.8*   Hematocrit 36* 33* 34*   RBC 4.3* 3.7* 3.9*   Platelets 206 259 279   Neut # K/uL 3.7 3.4 2.5   Lymph # K/uL 1.3 0.9* 1.1*   Mono # K/uL 0.4 0.5 0.4   Eos # K/uL 0.1 0.1 0.3   Baso # K/uL 0.0 0.0 0.0   Seg Neut % 66.4 68.4 57.0   Lymphocyte % 23.6 18.8 24.4   Monocyte % 7.3 9.2 10.1   Eosinophil % 2.1 2.8 7.1   Basophil % 0.2 0.4 0.7               Lab results: 08/11/21  1426 04/17/21  0552 04/16/21  0612 01/02/21  1233 11/19/20  0852   Sodium 135 137 137   < > 132*   132*   132*   Potassium 4.2 3.5 3.8   < > 4.1   4.1   4.1   Chloride 97 104 106   < >  96*   96*   96*   CO2 26 22 19*   < > 25   25   25    UN 8 14 12    < > 18   18   18    Creatinine 0.79 0.82 0.73   < > 1.1   1.1   1.1   GFR,Caucasian  --   --   --   --  67.83   67.83   67.83   GFR,Black  --   --   --   --  82.07   82.07   82.07   Glucose 219* 93 102*   < > 321*   321*   321*   Calcium 9.5 9.3 9.1   < > 9.2   9.2   9.2   Total Protein 7.0 6.8 7.1   < > 7.2   7.2   7.2   Albumin 4.2 3.4* 3.5   < > 4.1   4.1   4.1   ALT 9 12 12    < > 14   14   14    AST 8 15 15    < > 12   12   12    Alk Phos 141* 109 116   < > 143*   143*   143*   Bilirubin,Total 0.3 0.4 0.4   < > 0.4   0.4   0.4    < > = values in this interval not displayed.             Lab results: 08/11/21  1426   LD 197     No results for input(s): C1992, CEA3 in the last 8760 hours.      Lab results: 11/19/20  0852   PSA (eff. 01-2009) 1.06       No results for input(s): VID25 in the last 8760 hours.      Lab results: 08/11/21  1426   Phosphorus 3.8       Imaging:  CT chest with IV contrast    Result Date: 07/25/2021  Worsening lung parenchymal metastatic disease. END OF  IMPRESSION [facility] submits this DICOM format image data and final report to the [facility], an independent secure electronic health information exchange, on a reciprocally searchable basis (with patient authorization) for a minimum of 12 months after exam date.        Pathology:   [phone]   Additional Copy to:   [place]     FINAL DIAGNOSIS:   Outside material received from [place], [place] for   consultation.     [phone], 03/26/2021):   Bone and soft tissue, left hip, biopsy:   -Chondrosarcoma, grade 2 of 3.   MICROSCOPIC EXAMINATION: Performed     CLINICAL INFORMATION:   Outside material received for review prior to patient's next   appointment date/time: 07/22/2021     [id number]   Additional Copy to:   [facility]     FINAL DIAGNOSIS:   Outside material received from [facility], [place], [place] for   consultation.     [phone], 06/02/2021):   Lower extremity, left leg and hemipelvis, external hemipelvectomy and   leg amputation:   - Chondrosaroma.    - Procedure: Radical resection.       - Tumor site: Femur.    - Tumor size: 11.7 cm in greatest dimension (grossly).       - Tumor location and extent: Proximal femur and inferior  pelvis, extending into the surrounding fascia.    - Histologic type: Chondrosarcoma.    - Histologic grade: G3, poorly differentiated, high grade.       - Mitotic rate: up to 9 mitoses per 10 high-power fields.    - Necrosis: Present, 10%.    - Treatment effect: No known presurgical therapy.    - Lymphovascular invasion: Not identified.    - Margins: All margins negative for tumor.       - Regional lymph node status: No malignancy identified in five       lymph nodes (0/5) (per outside report, see comment).    - Pathologic stage: pT2pN0     Comment: Per outside report, all five lymph nodes were negative for   malignancy, however, only 31 of the 32 slides were provided for    evaluation, the missing slide, A32-representative sections from one   possible lymph node, was not available for examination. The four lymph   nodes examined are negative for malignancy.       Assessment:   CARMEN VALLECILLO is a 65 y.o. male with metastatic chondrosarcoma, per path undifferentiated component, IDH1 mutant.  [facility] 557[facility] will request ct chest next available to assess for stability on ivosidinib, previously relatively rapidly progressive.  He is tolerating ivosidinib well.  Will also request PD-L1 staining, consider ipi 1 / nivo 3 as next line of therapy if/when needed.  [id number] showed response in 1 of 5 patients with chrondrosarcoma (20%), [email or web address], and alliance trial did show overall higher response rates in soft tissue sarcomas to dual checkpoints ipi 1 /nivo 3 x4 than single agent PD-L1.      Stage: IV  Chemotherapy/treatment: ivosdidenib 5033mpo qd  Intent: control and containment    Recommendations:  Chrondrosarcoma:  -- continue ivosidinib 50073mo every day  -- start ipi/nivo today  -- STAT PET and MRI right hip and L spine  -- patient did note that if he has disease in his right hip he does nto want to go ahead with treatments  -- reviewed increasing fluid intake to help with constipation and mucous  -- mucinex works best with a large amounts of water  -- continue to work with urology over foley (underwent internal urethromy Dr. ZahLaddie Aquas10/22, noted to be retaining over 1000m1m18 and as I understand it has had foley since)  -- rx sent for topical flagyl for wound in left groin  -- trazodone 50mg70m for insomnia   -- increase morphine ER to 45mg 57mfrom 30 mg BID    History DVT:  -- on now-amputated leg, nonocclusive, noted 03/24/21  -- [facility]  -- will follow up IVC filter at next imaging, discuss possible removal  -- currently not on  anticoagulation, given removal of leg with DVT do not see that we need to start this now    Symptom control:  Pain:  -- morphine as above  -- PCP has written for gabapentin, duloxetine, also chronically on clonazepam  -- consider palliative care referral in the future if not adequately controled on this    Follow up: with CT chest    The patient expressed an understanding and agreement with the plan. All questions were answered. The patient knows to call our office with any concerns or questions should they arise.     I personally spent 45 min on the calendar day  of the encounter, including pre and post visit work. Activities include:Preparing to see the patient (eg, review of tests), Obtaining and/or reviewing separately obtained history, Performing a medically appropriate examination and/or evaluation. Counseling and educating the patient/family/caregiver. Ordering medications, tests, or procedures. Referring and communicating with other health care professionals (when not separately reported). Documenting clinical information in the electronic or other health record. Independently interpreting results (not separately reported) and communicating results to the patient/family/caregiver. Care coordination (not separately reported)      Plan of care was reviewed with Dr. Dorthula Nettles, attending oncologist.    Maylon Peppers, FNP-C

## 2021-08-12 ENCOUNTER — Encounter: Payer: Self-pay | Admitting: Gastroenterology

## 2021-08-12 LAB — HEB B INT

## 2021-08-12 LAB — HEPATITIS B CORE ANTIBODY, TOTAL: HBV Core Ab: NEGATIVE

## 2021-08-12 LAB — HEPATITIS B SURFACE ANTIGEN: HBV S Ag: NEGATIVE

## 2021-08-12 LAB — HEPATITIS B SURFACE ANTIBODY
HBV S Ab Quant: 0.94 m[IU]/mL
HBV S Ab: NEGATIVE

## 2021-08-12 LAB — HEMOGLOBIN A1C: Hemoglobin A1C: 9.5 % — ABNORMAL HIGH

## 2021-08-14 ENCOUNTER — Emergency Department: Payer: Medicaid Other

## 2021-08-14 ENCOUNTER — Emergency Department
Admission: EM | Admit: 2021-08-14 | Discharge: 2021-09-04 | Disposition: E | Payer: Medicaid Other | Source: Ambulatory Visit | Attending: Physician Assistant | Admitting: Physician Assistant

## 2021-08-14 ENCOUNTER — Encounter: Payer: Self-pay | Admitting: Gastroenterology

## 2021-08-14 DIAGNOSIS — I469 Cardiac arrest, cause unspecified: Secondary | ICD-10-CM | POA: Insufficient documentation

## 2021-08-14 DIAGNOSIS — I4589 Other specified conduction disorders: Secondary | ICD-10-CM

## 2021-08-14 DIAGNOSIS — I4510 Unspecified right bundle-branch block: Secondary | ICD-10-CM

## 2021-08-14 DIAGNOSIS — R4789 Other speech disturbances: Secondary | ICD-10-CM

## 2021-08-14 DIAGNOSIS — I213 ST elevation (STEMI) myocardial infarction of unspecified site: Secondary | ICD-10-CM

## 2021-08-14 DIAGNOSIS — R410 Disorientation, unspecified: Secondary | ICD-10-CM

## 2021-08-14 DIAGNOSIS — R001 Bradycardia, unspecified: Secondary | ICD-10-CM

## 2021-08-14 LAB — CBC AND DIFFERENTIAL
Baso # K/uL: 0 10*3/uL (ref 0.0–0.1)
Basophil %: 0.3 %
Eos # K/uL: 0.2 10*3/uL (ref 0.0–0.5)
Eosinophil %: 3.4 %
Hematocrit: 37 % — ABNORMAL LOW (ref 40–51)
Hemoglobin: 11.7 g/dL — ABNORMAL LOW (ref 13.7–17.5)
IMM Granulocytes #: 0 10*3/uL (ref 0.0–0.0)
IMM Granulocytes: 0.6 %
Lymph # K/uL: 2 10*3/uL (ref 1.3–3.6)
Lymphocyte %: 27.6 %
MCH: 27 pg (ref 26–32)
MCHC: 32 g/dL (ref 32–37)
MCV: 86 fL (ref 79–92)
Mono # K/uL: 0.5 10*3/uL (ref 0.3–0.8)
Monocyte %: 6.3 %
Neut # K/uL: 4.4 10*3/uL (ref 1.8–5.4)
Nucl RBC # K/uL: 0 10*3/uL (ref 0.0–0.0)
Nucl RBC %: 0 /100 WBC (ref 0.0–0.2)
Platelets: 162 10*3/uL (ref 150–330)
RBC: 4.3 MIL/uL — ABNORMAL LOW (ref 4.6–6.1)
RDW: 15.6 % — ABNORMAL HIGH (ref 11.6–14.4)
Seg Neut %: 61.8 %
WBC: 7.2 10*3/uL (ref 4.2–9.1)

## 2021-08-14 LAB — BASIC METABOLIC PANEL
Anion Gap: 22 — ABNORMAL HIGH (ref 7–16)
CO2: 18 mmol/L — ABNORMAL LOW (ref 20–28)
Calcium: 8.4 mg/dL — ABNORMAL LOW (ref 8.6–10.2)
Chloride: 100 mmol/L (ref 96–108)
Creatinine: 1 mg/dL (ref 0.67–1.17)
Glucose: 334 mg/dL — ABNORMAL HIGH (ref 60–99)
Lab: 12 mg/dL (ref 6–20)
Potassium: 2.6 mmol/L — CL (ref 3.4–4.7)
Sodium: 140 mmol/L (ref 133–145)
eGFR BY CREAT: 84 *

## 2021-08-14 LAB — EKG 12-LEAD
P: INVALID deg
QRS: -8 deg
QRSD: 145 ms
QT: 373 ms
QTc: 430 ms
Rate: 80 {beats}/min
T: 150 deg

## 2021-08-14 LAB — TROPONIN T 0 HR HIGH SENSITIVITY (IP/ED ONLY): TROP T 0 HR High Sensitivity: 34 ng/L — ABNORMAL HIGH (ref 0–11)

## 2021-08-14 MED ORDER — SODIUM CHLORIDE 0.9 % IV BOLUS *I*
1000.0000 mL | Freq: Once | Status: DC
Start: 2021-08-14 — End: 2021-08-14

## 2021-08-14 NOTE — ED Notes (Signed)
Post mortem care done. Coroner was in to see pt. [facility] placed body on hold. Family took belongings including ring and watch.

## 2021-08-14 NOTE — ED Provider Notes (Signed)
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  History otherwise is a bit difficult to ascertain at this point.  Patient is only minimally responsive on arrival severely hypoxic despite being on nonrebreather.  Patient is a bit bradycardic but does respond to verbal cues on arrival.  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    Deep vein thrombosis (DVT) of left lower extremity I82.402    Dyslipidemia E78.5    Essential (primary) hypertension I10    Nicotine use disorder F17.200    Obesity, Class II, BMI 35-39.9 E66.9    Pulmonary nodules R91.8    Retention of urine R33.9    Subperiosteal abscess of left femur M86.152    Type 2 diabetes mellitus E11.9    Fracture of femur S72.90XA            Past Surgical History:   Procedure Laterality Date    PR CYSTOURETHROSCOPY N/A 01/07/2021    Procedure: urethroscopy;  Surgeon: Lowella Dandy, MD;  Location: [facility] MAIN OR;  Service: Urology    PR CYSTOURETHROSCOPY N/A 03/14/2021    Procedure: CYSTOSCOPY with  internal uretheromy;  Surgeon: Lowella Dandy, MD;   Location: [facility] MAIN OR;  Service: Urology    TONSILLECTOMY AND [place]     History reviewed. No pertinent family history.       Social History     Tobacco Use    Smoking status: Former     Years: 57.00     Types: Cigarettes     Start date: 1964    Smokeless tobacco: Never   Substance Use Topics    Alcohol use: Not Currently    Drug use: Yes     Types: Marijuana     Living Situation     Questions Responses    Patient lives with     Homeless     Caregiver for other family member     External Services     Employment     Domestic Violence Risk                 Review of Systems   Review of Systems   Unable to perform ROS: Acuity of condition       Physical Exam     Triage Vitals  Triage Start: Start, (08/31/2021  1513)   First Recorded BP: 115/88, Resp: 21 Oxygen Therapy SpO2: (!) 80 %, Oximetry Source: Lt Ear, O2 Device: O2 Therapy, O2 Therapy: High flow nasal cannula, O2 Flow Rate: 15 L/min, Heart Rate: 64, (09/01/2021 1514)  .      Physical Exam  Constitutional:       General: He is in acute distress.      Appearance: He is ill-appearing and toxic-appearing.   HENT:      Head: Normocephalic.   Eyes:      Extraocular Movements: Extraocular movements intact.      Pupils: Pupils are equal, round, and reactive to light.   Cardiovascular:      Rate and Rhythm: Regular rhythm. Bradycardia present.   Abdominal:      General: Abdomen is flat. Bowel sounds are normal.      Palpations: Abdomen is soft.   Musculoskeletal:      Comments: AKA left lower extremity   Skin:     Capillary Refill: Capillary refill takes more than 3 seconds.      Comments: Pearline Cables mottled cool   Neurological:      Mental Status: He is disoriented.      Sensory: Sensation is intact.   Psychiatric:         Speech: Speech is delayed.         Medical Decision Making     Assessment:  Cardiac arrest.    Differential diagnosis:  PEA cardiac arrest, MI, CVA/TIA    EKG Interpretation: EKG shows a rate of 80 with ST segment elevations in V1 to 345 and 6.  T wave inversions in aVL and leads I.  Highly suggestive of an inferior infarct.  No previous for comparison.    ED Course and Disposition:  Patient was transferred to our stretcher from the EMS stretcher.  Patient was unresponsive from that point on.  Pulse ox was only 88% at its highest despite nonrebreather and attempting to bag.  Patient had a large full beard making bagging very difficult.  Decision was made to intubate.  Patient was intubated using glide scope in 1 attempt with a 7 oh ET tube 24 cm at the lip.  Tube was visualized going through the vocal cords.  Color change on capnography.  Good bilateral breath sounds.  At this point we continued resuscitation but unfortunately within a few minutes we lost pulses.  Initial rhythm was PEA with no pulses good-quality compressions were started immediately.  These continued throughout the resuscitation.  Epinephrine was given at 3-minute intervals.  Compressor was changed at 2-minute intervals with rapid pulse and rhythm check.  Patient never deviated from PEA unfortunately.  Review of records by her hospitalist indicated the patient had a history of DVT with a failed filter so PE was considered high on the list of precipitating events so thrombolytics were given without effect.  Amiodarone was given as well as bicarb and calcium chloride.  Unfortunately these also had no effect.  Approximately 20 minutes into the resuscitation I went to talk to his wife and I believe his sister who did confirm that he was a full code.  They were apprised of the critical nature of his situation.  I then returned to the room.  Dr. Carren Rang was in attendance.  Shortly after at 315 the patient was pronounced dead unfortunately.              Rodena Medin, PA  Lenard Forth, [place]  08/20/2021 [phone]

## 2021-08-14 NOTE — Code Documentation (Signed)
Code Nursing Note  Patient presented to ER after witnessed syncopal episode outside the ambulance bay, on arrival to ER patient was verbal, but had visible difficulty breathing, he was on 15L non-re breather oxygen saturation in the 80's he was tubed at 1443 with a 7.5 24 at the lip, confirmed with auscultation and co2 detector.   At 1454 patients rhythm changed to PEA compression started  1454 epi given  1456 pulse check/compressions resumed, second epi given  1457 bicarb given  1458 calcium chloride given, pulse check compressions resumed  1459 epi given  1459 PEA  1500 300mg  amiodorone given  1502 pulse check compressions resumed, epi given  1505 pulse check compressions resumed, epi given   1508 50mg  of TPA given   1509 pulse check, compressions resumed epi given  1512 pulse check, compressions resumed  1513 sodium bicarb given  1513 pulse check, PEA     1514. Time of death, code ended         Kristen Cardinal, RN, 08/17/2021, 3:22 PM

## 2021-08-14 NOTE — ED Triage Notes (Signed)
Patient arrived at ER via EMS after patient had a syncopal episode outside the ambulance bay, on arrival patient is talking to staff              Prehospital medications given: No

## 2021-08-15 ENCOUNTER — Telehealth: Payer: Self-pay | Admitting: Hematology and Oncology

## 2021-08-15 NOTE — Telephone Encounter (Signed)
Talked to coroner Dylan Foust.   Leisa Lenz [place] in wheelchair [place], started slumping over, happened to be near ambulance bay.  Can see ED notes but essentially unresponsive/PEA arrest on arrival in ED, intubated, resuscitation not successful.  Suspicion for cardiovascular event such as myocardial infarction or pulmonary embolism, but family declined autopsy, so will be unable to know for sure.    [employer] for the updates.      Dorthula Nettles, MD  [facility]. [facility]   Division of Hematology/Oncology   Department of Medicine

## 2021-08-18 ENCOUNTER — Other Ambulatory Visit: Payer: Medicaid Other

## 2021-08-21 ENCOUNTER — Encounter: Payer: Self-pay | Admitting: Gastroenterology

## 2021-08-22 ENCOUNTER — Telehealth: Payer: Medicaid Other | Admitting: Primary Care

## 2021-09-01 ENCOUNTER — Ambulatory Visit: Payer: Medicaid Other | Admitting: Family

## 2021-09-01 ENCOUNTER — Ambulatory Visit: Payer: Medicaid Other

## 2021-09-03 ENCOUNTER — Ambulatory Visit: Payer: Medicaid Other

## 2021-09-04 DEATH — deceased

## 2021-09-05 ENCOUNTER — Other Ambulatory Visit: Payer: Medicaid Other

## 2021-09-22 ENCOUNTER — Ambulatory Visit: Payer: Medicaid Other

## 2021-09-22 ENCOUNTER — Ambulatory Visit: Payer: Medicaid Other | Admitting: Family

## 2021-10-13 ENCOUNTER — Ambulatory Visit: Payer: Medicaid Other | Admitting: Family

## 2021-10-13 ENCOUNTER — Ambulatory Visit: Payer: Medicaid Other

## 8208-05-05 DEATH — deceased
